# Patient Record
Sex: Female | Born: 1990
Health system: Southern US, Community
[De-identification: ages and names within clinical notes are randomized; demographics above are authoritative.]

## PROBLEM LIST (undated history)

## (undated) DIAGNOSIS — J45909 Unspecified asthma, uncomplicated: Secondary | ICD-10-CM

---

## 2015-05-19 DIAGNOSIS — L2084 Intrinsic (allergic) eczema: Secondary | ICD-10-CM | POA: Insufficient documentation

## 2017-12-01 DIAGNOSIS — Z720 Tobacco use: Secondary | ICD-10-CM | POA: Insufficient documentation

## 2018-05-07 DIAGNOSIS — L309 Dermatitis, unspecified: Secondary | ICD-10-CM | POA: Diagnosis not present

## 2018-06-18 DIAGNOSIS — F411 Generalized anxiety disorder: Secondary | ICD-10-CM | POA: Diagnosis not present

## 2018-06-29 DIAGNOSIS — R404 Transient alteration of awareness: Secondary | ICD-10-CM | POA: Diagnosis not present

## 2018-06-29 DIAGNOSIS — F10929 Alcohol use, unspecified with intoxication, unspecified: Secondary | ICD-10-CM | POA: Diagnosis not present

## 2018-07-11 DIAGNOSIS — L2089 Other atopic dermatitis: Secondary | ICD-10-CM | POA: Diagnosis not present

## 2018-07-14 DIAGNOSIS — L309 Dermatitis, unspecified: Secondary | ICD-10-CM | POA: Diagnosis not present

## 2018-07-14 DIAGNOSIS — R21 Rash and other nonspecific skin eruption: Secondary | ICD-10-CM | POA: Diagnosis not present

## 2018-10-06 ENCOUNTER — Other Ambulatory Visit: Payer: Self-pay

## 2018-10-06 ENCOUNTER — Encounter (HOSPITAL_COMMUNITY): Payer: Self-pay | Admitting: Emergency Medicine

## 2018-10-06 ENCOUNTER — Emergency Department (HOSPITAL_COMMUNITY)
Admission: EM | Admit: 2018-10-06 | Discharge: 2018-10-06 | Disposition: A | Discharge status: Home / Self Care | Payer: BLUE CROSS/BLUE SHIELD | Attending: Emergency Medicine | Admitting: Emergency Medicine

## 2018-10-06 DIAGNOSIS — R0789 Other chest pain: Secondary | ICD-10-CM | POA: Diagnosis not present

## 2018-10-06 DIAGNOSIS — R9389 Abnormal findings on diagnostic imaging of other specified body structures: Secondary | ICD-10-CM | POA: Diagnosis not present

## 2018-10-06 DIAGNOSIS — L299 Pruritus, unspecified: Secondary | ICD-10-CM | POA: Diagnosis not present

## 2018-10-06 DIAGNOSIS — R05 Cough: Secondary | ICD-10-CM | POA: Diagnosis not present

## 2018-10-06 DIAGNOSIS — T7840XA Allergy, unspecified, initial encounter: Secondary | ICD-10-CM

## 2018-10-06 DIAGNOSIS — T7801XA Anaphylactic reaction due to peanuts, initial encounter: Secondary | ICD-10-CM | POA: Insufficient documentation

## 2018-10-06 DIAGNOSIS — J45909 Unspecified asthma, uncomplicated: Secondary | ICD-10-CM | POA: Diagnosis not present

## 2018-10-06 MED ORDER — EPINEPHRINE 0.3 MG/0.3ML IJ SOAJ
0.3000 mg | Freq: Once | INTRAMUSCULAR | Status: AC
  Administered 2018-10-06: 0.3 mg via INTRAMUSCULAR
  Filled 2018-10-06: qty 0.3

## 2018-10-06 MED ORDER — DIPHENHYDRAMINE HCL 50 MG/ML IJ SOLN
25.0000 mg | Freq: Once | INTRAMUSCULAR | Status: AC
  Administered 2018-10-06: 25 mg via INTRAVENOUS
  Filled 2018-10-06: qty 1

## 2018-10-06 MED ORDER — FAMOTIDINE IN NACL 20-0.9 MG/50ML-% IV SOLN
20.0000 mg | Freq: Once | INTRAVENOUS | Status: AC
  Administered 2018-10-06: 20 mg via INTRAVENOUS
  Filled 2018-10-06: qty 50

## 2018-10-06 NOTE — Discharge Instructions (Signed)
Return to ED if you have another allergic reaction.

## 2018-10-06 NOTE — ED Notes (Signed)
[facility] came to this nurse, [facility] is at [facility] wanting discharge papers.  Merlinda FrederickJennifer, [place] RN gave pt discharge instructions and paperwork.

## 2018-10-06 NOTE — ED Provider Notes (Signed)
  Physical Exam  BP 106/73 (BP Location: Right Arm)   Pulse 64   Temp 98.2 F (36.8 C) (Oral)   Resp (!) 24   Ht 5\' 6"  (1.676 m)   Wt 59 kg   SpO2 98%   BMI 20.98 kg/m   Physical Exam Vitals signs and nursing note reviewed.  Constitutional:      General: She is not in acute distress.    Appearance: She is well-developed. She is not diaphoretic.     Comments: Speaking in complete sentences without difficulty.  HENT:     Head: Normocephalic and atraumatic.  Eyes:     General: No scleral icterus.    Conjunctiva/sclera: Conjunctivae normal.  Neck:     Musculoskeletal: Normal range of motion.  Pulmonary:     Effort: Pulmonary effort is normal. No respiratory distress.  Skin:    Findings: No rash.  Neurological:     Mental Status: She is alert.     ED Course/Procedures     Procedures  MDM  Care handed off from previous provider Dr. Rhunette Croft.  Please see his note for further detail.  Briefly, patient is a 28 year old female who is allergic to peanuts.  She has a history of severe allergic reaction to peanuts.  She accidentally ingested peanuts at [facility] and began having itching to the back of her throat and lip swelling.  She is given IM epi and Benadryl by EMS and at the facility.  She continues to have itching.  She was given repeat dose of IM epi here, along with IV Benadryl and IV Pepcid.  Previous provider did not appreciate any lip swelling on evaluation.  Plan is to observe until 10 PM and reassess.   10:01 PM Patient with improvement in her symptoms.  No lip swelling, signs of respiratory distress or airway compromise noted on my exam.  States that itching has resolved.  She is at [facility] and they have EpiPen's there.  Patient is hemodynamically stable, in NAD, and able to ambulate in the ED. Evaluation does not show pathology that would require ongoing emergent intervention or inpatient treatment. I explained the diagnosis to the patient. Pain has been  managed and has no complaints prior to discharge. Patient is comfortable with above plan and is stable for discharge at this time. All questions were answered prior to disposition. Strict return precautions for returning to the ED were discussed. Encouraged follow up with PCP.    Portions of this note were generated with Scientist, clinical (histocompatibility and immunogenetics). Dictation errors may occur despite best attempts at proofreading.    Dietrich Pates, PA-C 10/06/18 2202    Rolan Bucco, MD 10/06/18 2248

## 2018-10-06 NOTE — ED Provider Notes (Addendum)
[facility] EMERGENCY DEPARTMENT Provider Note   CSN: [id number] Arrival date & time: 10/06/18  1802     History   Chief Complaint No chief complaint on file.   HPI Sharon Freeman is a 28 y.o. female.  HPI 28 year old female comes in with chief complaint of allergic reaction.  Patient states that she has history of severe allergic reaction to peanuts, and thinks that she ingested peanuts at [facility]. Medially after ingesting the peanut she started having itching to the back of her throat and perhaps some lip swelling.  She was given 50 mg of Benadryl by the facility and in route she was given epi IM.  Patient states that she continues to have mild itching.  She denies any associated shortness of breath, wheezing, nausea, vomiting, abdominal pain, diarrhea.  She also denies any rash.   History reviewed. No pertinent past medical history.  There are no active problems to display for this patient.   History reviewed. No pertinent surgical history.   OB History   No obstetric history on file.      Home Medications    Prior to Admission medications   Not on File    Family History No family history on file.  Social History Social History   Tobacco Use  . Smoking status: Not on file  Substance Use Topics  . Alcohol use: Not on file  . Drug use: Not on file     Allergies   Patient has no allergy information on record.   Review of Systems Review of Systems  Constitutional: Positive for activity change.  HENT: Positive for sore throat. Negative for drooling, facial swelling, trouble swallowing and voice change.   Respiratory: Negative for chest tightness and shortness of breath.   Cardiovascular: Negative for chest pain.  Allergic/Immunologic: Positive for food allergies.  All other systems reviewed and are negative.    Physical Exam Updated Vital Signs BP 106/73 (BP Location: Right Arm)   Pulse 64   Temp 98.2 F (36.8 C)  (Oral)   Resp (!) 24   Ht 5\' 6"  (1.676 m)   Wt 59 kg   SpO2 98%   BMI 20.98 kg/m   Physical Exam Vitals signs and nursing note reviewed.  Constitutional:      Appearance: She is well-developed.  HENT:     Head: Normocephalic and atraumatic.     Mouth/Throat:     Comments: Neuro exam reveals no edema of the tongue or oral mucosa.  Patient has no drooling, no trismus and there is no stridor. Neck:     Musculoskeletal: Normal range of motion and neck supple.  Cardiovascular:     Rate and Rhythm: Normal rate.  Pulmonary:     Effort: Pulmonary effort is normal.     Breath sounds: No wheezing.  Abdominal:     General: Bowel sounds are normal.  Skin:    General: Skin is warm and dry.  Neurological:     Mental Status: She is alert and oriented to person, place, and time.      ED Treatments / Results  Labs (all labs ordered are listed, but only abnormal results are displayed) Labs Reviewed - No data to display  EKG None  Radiology No results found.  Procedures .Critical Care Performed by: Derwood Kaplan, MD Authorized by: Derwood Kaplan, MD   Critical care provider statement:    Critical care time (minutes):  32   Critical care time was exclusive of:  Separately  billable procedures and treating other patients   Critical care was time spent personally by me on the following activities:  Discussions with consultants, evaluation of patient's response to treatment, examination of patient, ordering and performing treatments and interventions, ordering and review of laboratory studies, ordering and review of radiographic studies, pulse oximetry, re-evaluation of patient's condition, obtaining history from patient or surrogate and review of old charts   (including critical care time)  Medications Ordered in ED Medications  famotidine (PEPCID) IVPB 20 mg premix (20 mg Intravenous New Bag/Given 10/06/18 1829)  EPINEPHrine (EPI-PEN) injection 0.3 mg (0.3 mg Intramuscular  Given 10/06/18 1830)  diphenhydrAMINE (BENADRYL) injection 25 mg (25 mg Intravenous Given 10/06/18 1828)     Initial Impression / Assessment and Plan / ED Course  I have reviewed the triage vital signs and the nursing notes.  Pertinent labs & imaging results that were available during my care of the patient were reviewed by me and considered in my medical decision making (see chart for details).     Patient comes in with chief complaint of allergic reaction. It appears that she had peanuts, and since that time she has been having itching to the back of her throat.  She is already received IM epi along with Benadryl.  She still feeling some scratching at the back of her throat.  There is no lip swelling on my evaluation, however she states there was some lip swelling early on.  We will give her another IM epinephrine and IV Benadryl along with IV Pepcid. Lung exam is clear.    Final Clinical Impressions(s) / ED Diagnoses   Final diagnoses:  Allergic reaction, initial encounter    ED Discharge Orders    None       Derwood Kaplan, MD 10/06/18 Ronna Polio, MD 10/06/18 1905

## 2018-10-06 NOTE — ED Notes (Signed)
Pt easily aroused when nurse walked in.  No c/o throat, lip, tongue itching or swelling.  Pt states "it feels normal now".

## 2018-10-06 NOTE — ED Notes (Addendum)
Pt was not in room when this nurse went to give discharge papers and instructions.

## 2018-10-06 NOTE — ED Triage Notes (Signed)
Pt presents to ED via [facility]. Pt was at [facility] when she ate some nuts at dinner that was cooked into the food. Pt is allergic to nuts. [facility] gave pt 50 mg po Benadryl and an epipen. Pt stated her lips and throat were red and itching. Itching is still present.  BP 106/68 HR 65 100% RA

## 2018-11-23 DIAGNOSIS — R05 Cough: Secondary | ICD-10-CM | POA: Diagnosis not present

## 2018-11-23 DIAGNOSIS — R109 Unspecified abdominal pain: Secondary | ICD-10-CM | POA: Diagnosis not present

## 2018-11-24 ENCOUNTER — Emergency Department (HOSPITAL_COMMUNITY)
Admission: EM | Admit: 2018-11-24 | Discharge: 2018-11-24 | Disposition: A | Discharge status: Home / Self Care | Payer: BLUE CROSS/BLUE SHIELD | Attending: Emergency Medicine | Admitting: Emergency Medicine

## 2018-11-24 ENCOUNTER — Emergency Department (HOSPITAL_COMMUNITY): Payer: BLUE CROSS/BLUE SHIELD

## 2018-11-24 ENCOUNTER — Encounter: Payer: Self-pay | Admitting: Emergency Medicine

## 2018-11-24 DIAGNOSIS — J45909 Unspecified asthma, uncomplicated: Secondary | ICD-10-CM | POA: Diagnosis not present

## 2018-11-24 DIAGNOSIS — F17210 Nicotine dependence, cigarettes, uncomplicated: Secondary | ICD-10-CM | POA: Insufficient documentation

## 2018-11-24 DIAGNOSIS — Z79899 Other long term (current) drug therapy: Secondary | ICD-10-CM | POA: Insufficient documentation

## 2018-11-24 DIAGNOSIS — R1011 Right upper quadrant pain: Secondary | ICD-10-CM | POA: Diagnosis not present

## 2018-11-24 HISTORY — DX: Unspecified asthma, uncomplicated: J45.909

## 2018-11-24 LAB — COMPREHENSIVE METABOLIC PANEL
ALT: 14 U/L (ref 0–44)
AST: 16 U/L (ref 15–41)
Albumin: 3.9 g/dL (ref 3.5–5.0)
Alkaline Phosphatase: 44 U/L (ref 38–126)
Anion gap: 7 (ref 5–15)
BUN: 9 mg/dL (ref 6–20)
CO2: 25 mmol/L (ref 22–32)
Calcium: 9.4 mg/dL (ref 8.9–10.3)
Chloride: 107 mmol/L (ref 98–111)
Creatinine, Ser: 0.81 mg/dL (ref 0.44–1.00)
GFR calc Af Amer: >60 mL/min (ref >60)
GFR calc non Af Amer: >60 mL/min (ref >60)
GLUCOSE: 85 mg/dL (ref 70–99)
Potassium: 3.7 mmol/L (ref 3.5–5.1)
SODIUM: 139 mmol/L (ref 135–145)
Total Bilirubin: 0.7 mg/dL (ref 0.3–1.2)
Total Protein: 6.7 g/dL (ref 6.5–8.1)

## 2018-11-24 LAB — CBC WITH DIFFERENTIAL/PLATELET
Abs Immature Granulocytes: 0.03 10*3/uL (ref 0.00–0.07)
BASOS PCT: 1 %
Basophils Absolute: 0.1 10*3/uL (ref 0.0–0.1)
Eosinophils Absolute: 0.6 10*3/uL — ABNORMAL HIGH (ref 0.0–0.5)
Eosinophils Relative: 6 %
HCT: 40.3 % (ref 36.0–46.0)
Hemoglobin: 13.1 g/dL (ref 12.0–15.0)
Immature Granulocytes: 0 %
Lymphocytes Relative: 9 %
Lymphs Abs: 0.9 10*3/uL (ref 0.7–4.0)
MCH: 30.5 pg (ref 26.0–34.0)
MCHC: 32.5 g/dL (ref 30.0–36.0)
MCV: 93.7 fL (ref 80.0–100.0)
Monocytes Absolute: 1.1 10*3/uL — ABNORMAL HIGH (ref 0.1–1.0)
Monocytes Relative: 11 %
NEUTROS PCT: 73 %
NRBC: 0 % (ref 0.0–0.2)
Neutro Abs: 7.2 10*3/uL (ref 1.7–7.7)
PLATELETS: 255 10*3/uL (ref 150–400)
RBC: 4.3 MIL/uL (ref 3.87–5.11)
RDW: 12 % (ref 11.5–15.5)
WBC: 9.8 10*3/uL (ref 4.0–10.5)

## 2018-11-24 LAB — LIPASE, BLOOD: Lipase: 36 U/L (ref 11–51)

## 2018-11-24 NOTE — ED Notes (Signed)
Patient verbalizes understanding of discharge instructions. Opportunity for questioning and answers were provided. Armband removed by staff, pt discharged from ED home via POV.  

## 2018-11-24 NOTE — ED Triage Notes (Signed)
Pt was seen at [facility] yesterday, has had right side rib cage pain that hurts with inspiration since Wednesday, states this happened about a month ago as well.

## 2018-11-24 NOTE — ED Notes (Signed)
EDP at bedside  

## 2018-11-24 NOTE — ED Provider Notes (Signed)
[facility] EMERGENCY DEPARTMENT Provider Note   CSN: [id number] Arrival date & time: 11/24/18  1219    History   Chief Complaint Chief Complaint  Patient presents with  . rib pain    HPI Sharon Freeman is a 28 y.o. female.     The history is provided by the patient and medical records. No language interpreter was used.   Sharon Freeman is a 28 y.o. female who presents to the Emergency Department complaining of right upper abdominal pain and right lateral rib cage pain.  Patient states that her pain began about 4 days ago.  No inciting event.  No known injury.  She was trying ibuprofen to help her pain with did help somewhat.  She saw the urgent care yesterday where she states an x-ray was done and she was told it was normal.  She was told that this was likely musculoskeletal and started on prednisone for inflammation, however the pharmacy did not have it, therefore she has not started it yet.  The pharmacy told her they should get it in today or tomorrow.  The urgent care told her that there was a chance this could be her gallbladder, therefore if her symptoms worsen, she should come to the emergency department.  When she woke up this morning, the pain was much worse, prompting her to seek care.  She reports associated nausea only after eating.  She has not had any emesis.  No fever or chills.  No urinary symptoms.  Past Medical History:  Diagnosis Date  . Asthma     There are no active problems to display for this patient.   History reviewed. No pertinent surgical history.   OB History   No obstetric history on file.      Home Medications    Prior to Admission medications   Medication Sig Start Date End Date Taking? Authorizing Provider  azithromycin (ZITHROMAX) 250 MG tablet Take 250-500 mg by mouth See admin instructions. 500mg  on day one then 250mg  daily until gone Started on 10-06-17    [provider]  benzonatate (TESSALON) 100 MG  capsule Take 100 mg by mouth 3 (three) times daily as needed for cough.    [provider]  clotrimazole (LOTRIMIN) 1 % cream Apply 1 application topically daily as needed (rash).    [provider]  diphenhydrAMINE (BENADRYL) 25 MG tablet Take 50 mg by mouth once.    [provider]  EPINEPHrine 0.3 mg/0.3 mL IJ SOAJ injection Inject 0.3 mg into the muscle as needed for anaphylaxis.    [provider]  fexofenadine (ALLEGRA) 180 MG tablet Take 180 mg by mouth daily.    [provider]  FLUoxetine (PROZAC) 20 MG capsule Take 20 mg by mouth daily.    [provider]  Fluticasone-Salmeterol (ADVAIR) 100-50 MCG/DOSE AEPB Inhale 1 puff into the lungs daily.    [provider]  halobetasol (ULTRAVATE) 0.05 % cream Apply 1 application topically daily as needed (eczema).    [provider]  hydrocortisone 2.5 % cream Apply 1 application topically daily as needed (itching).    [provider]  ibuprofen (ADVIL,MOTRIN) 600 MG tablet Take 600 mg by mouth every 6 (six) hours as needed for headache, moderate pain or cramping.    [provider]  ketotifen (ZADITOR) 0.025 % ophthalmic solution Place 1 drop into both eyes daily as needed (dry eyes).    [provider]  methylPREDNISolone (MEDROL DOSEPAK) 4 MG TBPK  tablet Take by mouth as directed. Started on 10-06-17    [provider]  PROAIR HFA 108 [phone] Base) MCG/ACT inhaler Inhale 1-2 puffs into the lungs every 4 (four) hours as needed for shortness of breath. 09/22/18   [provider]  propranolol (INDERAL) 20 MG tablet Take 20 mg by mouth 3 (three) times daily.    [provider]  traZODone (DESYREL) 50 MG tablet Take 50 mg by mouth at bedtime as needed for sleep.    [provider]    Family History History reviewed. No pertinent family history.  Social History Social History   Tobacco Use  . Smoking status: Current  Every Day Smoker    Packs/day: 1.00    Types: Cigarettes  . Smokeless tobacco: Never Used  Substance Use Topics  . Alcohol use: Not Currently    Comment: sober for 90 days   . Drug use: Not Currently     Allergies   Other and Shellfish allergy   Review of Systems Review of Systems  Gastrointestinal: Positive for abdominal pain and nausea. Negative for constipation, diarrhea and vomiting.  Musculoskeletal: Positive for arthralgias and myalgias.  All other systems reviewed and are negative.    Physical Exam Updated Vital Signs BP 114/80 (BP Location: Right Arm)   Pulse 90   Temp 99 F (37.2 C) (Oral)   Resp 14   LMP 11/07/2018 (Approximate)   SpO2 100%   Physical Exam Vitals signs and nursing note reviewed.  Constitutional:      General: She is not in acute distress.    Appearance: She is well-developed.     Comments: Nontoxic-appearing.  HENT:     Head: Normocephalic and atraumatic.  Neck:     Musculoskeletal: Neck supple.  Cardiovascular:     Rate and Rhythm: Normal rate and regular rhythm.     Heart sounds: Normal heart sounds. No murmur.  Pulmonary:     Effort: Pulmonary effort is normal. No respiratory distress.     Breath sounds: Normal breath sounds.  Abdominal:     General: There is no distension.     Palpations: Abdomen is soft.     Comments: Tenderness to palpation of the right upper quadrant.  He also has tenderness along the right lower lateral rib cage region.  No overlying skin changes appreciated.  Skin:    General: Skin is warm and dry.  Neurological:     Mental Status: She is alert and oriented to person, place, and time.      ED Treatments / Results  Labs (all labs ordered are listed, but only abnormal results are displayed) Labs Reviewed  CBC WITH DIFFERENTIAL/PLATELET - Abnormal; Notable for the following components:      Result Value   Monocytes Absolute 1.1 (*)    Eosinophils Absolute 0.6 (*)    All other components within  normal limits  COMPREHENSIVE METABOLIC PANEL  LIPASE, BLOOD    EKG None  Radiology US Abdomen Limited Ruq  Result Date: 11/24/2018 CLINICAL DATA:  Right upper quadrant abdominal pain for 3 days EXAM: ULTRASOUND ABDOMEN LIMITED RIGHT UPPER QUADRANT COMPARISON:  None. FINDINGS: Gallbladder: No gallstones or wall thickening visualized. No sonographic Murphy sign noted by sonographer. Common bile duct: Diameter: 1 mm Liver: No focal lesion identified. Within normal limits in parenchymal echogenicity. Portal vein is patent on color Doppler imaging with normal direction of blood flow towards the liver. IMPRESSION: Normal right upper quadrant abdominal sonogram, with no cholelithiasis. Electronically Signed  By: Delbert Phenix M.D.   On: 11/24/2018 14:36    Procedures Procedures (including critical care time)  Medications Ordered in ED Medications - No data to display   Initial Impression / Assessment and Plan / ED Course  I have reviewed the triage vital signs and the nursing notes.  Pertinent labs & imaging results that were available during my care of the patient were reviewed by me and considered in my medical decision making (see chart for details).       Sharon Freeman is a 28 y.o. female who presents to ED for right upper quadrant abdominal pain versus right rib cage pain.  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  She was seen at the urgent care yesterday where she reports having normal x-ray.  I cannot see the results of this, therefore did recommend repeating imaging, however she states that they told her it was normal and does not see the need to repeat. On my evaluation, she appears quite well. She is afebrile, hemodynamically stable with tenderness to the right upper quadrant. She does have pain to the rib cage as well. Will obtain labs and RUQ ultrasound to further evaluate abdominal pain.   Labs reviewed and  reassuring. Ultrasound with no acute findings.   She is tolerating PO, ambulatory in ED. Repeat abd exam with no peritoneal signs. Encouraged PCP follow up. Symptomatic home care instructions and return precautions discussed and all questions answered.    Final Clinical Impressions(s) / ED Diagnoses   Final diagnoses:  RUQ pain    ED Discharge Orders    None       Sharne Linders, Chase Picket, PA-C 11/24/18 1507    Shaune Pollack, MD 11/24/18 Paulo Fruit

## 2018-11-24 NOTE — Discharge Instructions (Signed)
It was my pleasure taking care of you today!   Fortunately, your blood work and ultrasound today were normal.  Continue taking Tylenol and Ibuprofen as needed for pain.   Follow up with your doctor.   Return to ER for new or worsening symptoms, any additional concerns.

## 2019-07-29 DIAGNOSIS — Z20828 Contact with and (suspected) exposure to other viral communicable diseases: Secondary | ICD-10-CM | POA: Diagnosis not present

## 2019-08-01 DIAGNOSIS — F411 Generalized anxiety disorder: Secondary | ICD-10-CM | POA: Diagnosis not present

## 2019-08-12 DIAGNOSIS — F411 Generalized anxiety disorder: Secondary | ICD-10-CM | POA: Diagnosis not present

## 2019-08-27 DIAGNOSIS — F411 Generalized anxiety disorder: Secondary | ICD-10-CM | POA: Diagnosis not present

## 2019-09-10 DIAGNOSIS — F411 Generalized anxiety disorder: Secondary | ICD-10-CM | POA: Diagnosis not present

## 2019-09-17 DIAGNOSIS — F411 Generalized anxiety disorder: Secondary | ICD-10-CM | POA: Diagnosis not present

## 2019-10-03 DIAGNOSIS — F411 Generalized anxiety disorder: Secondary | ICD-10-CM | POA: Diagnosis not present

## 2019-10-15 DIAGNOSIS — F411 Generalized anxiety disorder: Secondary | ICD-10-CM | POA: Diagnosis not present

## 2019-10-31 DIAGNOSIS — F411 Generalized anxiety disorder: Secondary | ICD-10-CM | POA: Diagnosis not present

## 2019-11-13 DIAGNOSIS — F411 Generalized anxiety disorder: Secondary | ICD-10-CM | POA: Diagnosis not present

## 2019-11-26 DIAGNOSIS — F411 Generalized anxiety disorder: Secondary | ICD-10-CM | POA: Diagnosis not present

## 2019-11-28 ENCOUNTER — Institutional Professional Consult (permissible substitution): Payer: BLUE CROSS/BLUE SHIELD | Admitting: Family Medicine

## 2019-12-05 ENCOUNTER — Ambulatory Visit: Payer: Self-pay | Attending: Internal Medicine

## 2019-12-10 DIAGNOSIS — F411 Generalized anxiety disorder: Secondary | ICD-10-CM | POA: Diagnosis not present

## 2019-12-19 ENCOUNTER — Ambulatory Visit: Payer: BLUE CROSS/BLUE SHIELD | Admitting: Family Medicine

## 2019-12-19 ENCOUNTER — Encounter: Payer: Self-pay | Admitting: Family Medicine

## 2019-12-19 ENCOUNTER — Other Ambulatory Visit: Payer: Self-pay

## 2019-12-19 VITALS — BP 110/70 | HR 82 | Temp 97.0°F | Ht 66.0 in | Wt 125.0 lb

## 2019-12-19 DIAGNOSIS — J302 Other seasonal allergic rhinitis: Secondary | ICD-10-CM | POA: Diagnosis not present

## 2019-12-19 DIAGNOSIS — F1911 Other psychoactive substance abuse, in remission: Secondary | ICD-10-CM

## 2019-12-19 DIAGNOSIS — R5383 Other fatigue: Secondary | ICD-10-CM | POA: Diagnosis not present

## 2019-12-19 DIAGNOSIS — Z862 Personal history of diseases of the blood and blood-forming organs and certain disorders involving the immune mechanism: Secondary | ICD-10-CM

## 2019-12-19 DIAGNOSIS — F39 Unspecified mood [affective] disorder: Secondary | ICD-10-CM

## 2019-12-19 DIAGNOSIS — J4520 Mild intermittent asthma, uncomplicated: Secondary | ICD-10-CM | POA: Insufficient documentation

## 2019-12-19 NOTE — Progress Notes (Signed)
   Subjective:    Patient ID: Sharon Freeman, female    DOB: Mar 08, 1991, 29 y.o.   MRN: [id number]  HPI Chief Complaint  Patient presents with  . new pt    new pt get established. no concerns   She is new to the practice and here to establish care. Previous medical care: moved here from [place], [place] about 1 1/2 years ago   Reports a history of severe substance abuse. States she lives at [facility] and has been there for several months.  They are prescribing her medications and she is involved in counseling.  Reports doing well.  States she has been having intermittent spells of dry eyes, fatigue and anxiety.  States this occurs every few weeks and last for approximately 3 days.  Symptoms go away on their own.  No known triggers. She is not sure if this occurs with her menstrual cycles.  Reports a history of anemia.    Smoker- for the past 2 years, 7-10 cigs per day   Asthma- she has Advair but is not currently using it. Uses albuterol no more than 2x weekly. Exercise is a trigger.   Seasonal allergies- uses Flonase   Allergies to nuts and shellfish and has an Epi-pen   Social history: lives at [facility]. 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  Is not in a relationship  No kids    Reviewed allergies, medications, past medical, surgical, family, and social history.    Review of Systems Pertinent positives and negatives in the history of present illness.     Objective:   Physical Exam BP 110/70   Pulse 82   Temp (!) 97 F (36.1 C)   Ht 5\' 6"  (1.676 m)   Wt 125 lb (56.7 kg)   LMP 12/09/2019   BMI 20.18 kg/m   Alert and in no distress. Neck is supple without adenopathy or thyromegaly. Cardiac exam shows a regular sinus rhythm without murmurs or gallops. Lungs are clear to auscultation. Extremities without edema. Skin is warm and dry. No pallor.        Assessment & Plan:    Fatigue, unspecified type - Plan: CBC with Differential/Platelet, Comprehensive metabolic panel, TSH, T4, free, T3, VITAMIN D 25 Hydroxy (Vit-D Deficiency, Fractures), Vitamin B12, Iron, TIBC and Ferritin Panel -She is new to the practice and here to establish care.  Discussed multiple etiologies for fatigue.  Her exam is benign.  No red flag symptoms. Will check labs and follow-up.  She will return for a fasting CPE in 1 to 2 months.  Mild intermittent asthma without complication -Controlled.  She will let me know if she is needing her albuterol more than 2 times during the day per week or more than once at night per month.  She has Advair but does not need it at this time  Seasonal allergies -Not very bothersome at this point.  She uses Flonase  Substance abuse in remission [facility]) -Congratulated her on being in rehab.  Living at [facility].  Mood disorder (HCC) -Medications are managed by her psychiatrist and she also has therapist at [facility]  History of anemia - Plan: CBC with Differential/Platelet, Vitamin B12, Iron, TIBC and Ferritin Panel -Check labs in follow-up.  Is not on an iron supplement

## 2019-12-19 NOTE — Patient Instructions (Signed)
It was a pleasure meeting you today.   If you are needing your albuterol inhaler more than twice per week during the day or more than once per month at night, let me know. You will need to start back on your daily Advair inhaler at that point.   I recommend that you look for triggers for your fatigue and dry eyes.  We will check labs and follow up.

## 2019-12-20 LAB — CBC WITH DIFFERENTIAL/PLATELET
Basophils Absolute: 0.1 10*3/uL (ref 0.0–0.2)
Basos: 1 %
EOS (ABSOLUTE): 0.5 10*3/uL — ABNORMAL HIGH (ref 0.0–0.4)
Eos: 9 %
Hematocrit: 42.9 % (ref 34.0–46.6)
Hemoglobin: 14.7 g/dL (ref 11.1–15.9)
Immature Grans (Abs): 0 10*3/uL (ref 0.0–0.1)
Immature Granulocytes: 0 %
Lymphocytes Absolute: 1.5 10*3/uL (ref 0.7–3.1)
Lymphs: 25 %
MCH: 32 pg (ref 26.6–33.0)
MCHC: 34.3 g/dL (ref 31.5–35.7)
MCV: 93 fL (ref 79–97)
Monocytes Absolute: 0.5 10*3/uL (ref 0.1–0.9)
Monocytes: 8 %
Neutrophils Absolute: 3.2 10*3/uL (ref 1.4–7.0)
Neutrophils: 57 %
Platelets: 292 10*3/uL (ref 150–450)
RBC: 4.6 x10E6/uL (ref 3.77–5.28)
RDW: 11.7 % (ref 11.7–15.4)
WBC: 5.8 10*3/uL (ref 3.4–10.8)

## 2019-12-20 LAB — COMPREHENSIVE METABOLIC PANEL
ALT: 9 IU/L (ref 0–32)
AST: 11 IU/L (ref 0–40)
Albumin/Globulin Ratio: 2.2 (ref 1.2–2.2)
Albumin: 4.8 g/dL (ref 3.8–4.8)
Alkaline Phosphatase: 53 IU/L (ref 39–117)
BUN/Creatinine Ratio: 12 (ref 9–23)
BUN: 10 mg/dL (ref 6–20)
Bilirubin Total: 0.2 mg/dL (ref 0.0–1.2)
CO2: 26 mmol/L (ref 20–29)
Calcium: 9.8 mg/dL (ref 8.7–10.2)
Chloride: 102 mmol/L (ref 96–106)
Creatinine, Ser: 0.85 mg/dL (ref 0.57–1.00)
GFR calc Af Amer: 106 mL/min/{1.73_m2} (ref >59)
GFR calc non Af Amer: 92 mL/min/{1.73_m2} (ref >59)
Globulin, Total: 2.2 g/dL (ref 1.5–4.5)
Glucose: 107 mg/dL — ABNORMAL HIGH (ref 65–99)
Potassium: 4.4 mmol/L (ref 3.5–5.2)
Sodium: 143 mmol/L (ref 134–144)
Total Protein: 7 g/dL (ref 6.0–8.5)

## 2019-12-20 LAB — T4, FREE: Free T4: 1.21 ng/dL (ref 0.82–1.77)

## 2019-12-20 LAB — IRON,TIBC AND FERRITIN PANEL
Ferritin: 30 ng/mL (ref 15–150)
Iron Saturation: 29 % (ref 15–55)
Iron: 82 ug/dL (ref 27–159)
Total Iron Binding Capacity: 278 ug/dL (ref 250–450)
UIBC: 196 ug/dL (ref 131–425)

## 2019-12-20 LAB — VITAMIN D 25 HYDROXY (VIT D DEFICIENCY, FRACTURES): Vit D, 25-Hydroxy: 21.4 ng/mL — ABNORMAL LOW (ref 30.0–100.0)

## 2019-12-20 LAB — VITAMIN B12: Vitamin B-12: 455 pg/mL (ref 232–1245)

## 2019-12-20 LAB — T3: T3, Total: 81 ng/dL (ref 71–180)

## 2019-12-20 LAB — TSH: TSH: 1.11 u[IU]/mL (ref 0.450–4.500)

## 2019-12-27 DIAGNOSIS — F411 Generalized anxiety disorder: Secondary | ICD-10-CM | POA: Diagnosis not present

## 2020-01-07 DIAGNOSIS — F411 Generalized anxiety disorder: Secondary | ICD-10-CM | POA: Diagnosis not present

## 2020-01-23 DIAGNOSIS — F411 Generalized anxiety disorder: Secondary | ICD-10-CM | POA: Diagnosis not present

## 2020-01-28 DIAGNOSIS — F411 Generalized anxiety disorder: Secondary | ICD-10-CM | POA: Diagnosis not present

## 2020-02-12 DIAGNOSIS — F411 Generalized anxiety disorder: Secondary | ICD-10-CM | POA: Diagnosis not present

## 2020-02-25 DIAGNOSIS — F411 Generalized anxiety disorder: Secondary | ICD-10-CM | POA: Diagnosis not present

## 2020-03-01 NOTE — Patient Instructions (Addendum)
I recommend scheduling a dental exam soon.   We will be in touch with your results.     Preventive Care 72-31 Years Old, Female Preventive care refers to visits with your health care provider and lifestyle choices that can promote health and wellness. This includes:  A yearly physical exam. This may also be called an annual well check.  Regular dental visits and eye exams.  Immunizations.  Screening for certain conditions.  Healthy lifestyle choices, such as eating a healthy diet, getting regular exercise, not using drugs or products that contain nicotine and tobacco, and limiting alcohol use. What can I expect for my preventive care visit? Physical exam Your health care provider will check your:  Height and weight. This may be used to calculate body mass index (BMI), which tells if you are at a healthy weight.  Heart rate and blood pressure.  Skin for abnormal spots. Counseling Your health care provider may ask you questions about your:  Alcohol, tobacco, and drug use.  Emotional well-being.  Home and relationship well-being.  Sexual activity.  Eating habits.  Work and work Statistician.  Method of birth control.  Menstrual cycle.  Pregnancy history. What immunizations do I need?  Influenza (flu) vaccine  This is recommended every year. Tetanus, diphtheria, and pertussis (Tdap) vaccine  You may need a Td booster every 10 years. Varicella (chickenpox) vaccine  You may need this if you have not been vaccinated. Human papillomavirus (HPV) vaccine  If recommended by your health care provider, you may need three doses over 6 months. Measles, mumps, and rubella (MMR) vaccine  You may need at least one dose of MMR. You may also need a second dose. Meningococcal conjugate (MenACWY) vaccine  One dose is recommended if you are age 2-21 years and a first-year college student living in a residence hall, or if you have one of several medical conditions. You may  also need additional booster doses. Pneumococcal conjugate (PCV13) vaccine  You may need this if you have certain conditions and were not previously vaccinated. Pneumococcal polysaccharide (PPSV23) vaccine  You may need one or two doses if you smoke cigarettes or if you have certain conditions. Hepatitis A vaccine  You may need this if you have certain conditions or if you travel or work in places where you may be exposed to hepatitis A. Hepatitis B vaccine  You may need this if you have certain conditions or if you travel or work in places where you may be exposed to hepatitis B. Haemophilus influenzae type b (Hib) vaccine  You may need this if you have certain conditions. You may receive vaccines as individual doses or as more than one vaccine together in one shot (combination vaccines). Talk with your health care provider about the risks and benefits of combination vaccines. What tests do I need?  Blood tests  Lipid and cholesterol levels. These may be checked every 5 years starting at age 29.  Hepatitis C test.  Hepatitis B test. Screening  Diabetes screening. This is done by checking your blood sugar (glucose) after you have not eaten for a while (fasting).  Sexually transmitted disease (STD) testing.  BRCA-related cancer screening. This may be done if you have a family history of breast, ovarian, tubal, or peritoneal cancers.  Pelvic exam and Pap test. This may be done every 3 years starting at age 66. Starting at age 22, this may be done every 5 years if you have a Pap test in combination with an HPV  test. Talk with your health care provider about your test results, treatment options, and if necessary, the need for more tests. Follow these instructions at home: Eating and drinking   Eat a diet that includes fresh fruits and vegetables, whole grains, lean protein, and low-fat dairy.  Take vitamin and mineral supplements as recommended by your health care  provider.  Do not drink alcohol if: ? Your health care provider tells you not to drink. ? You are pregnant, may be pregnant, or are planning to become pregnant.  If you drink alcohol: ? Limit how much you have to 0-1 drink a day. ? Be aware of how much alcohol is in your drink. In the [place] one drink equals one 12 oz bottle of beer (355 mL), one 5 oz glass of wine (148 mL), or one 1 oz glass of hard liquor (44 mL). Lifestyle  Take daily care of your teeth and gums.  Stay active. Exercise for at least 30 minutes on 5 or more days each week.  Do not use any products that contain nicotine or tobacco, such as cigarettes, e-cigarettes, and chewing tobacco. If you need help quitting, ask your health care provider.  If you are sexually active, practice safe sex. Use a condom or other form of birth control (contraception) in order to prevent pregnancy and STIs (sexually transmitted infections). If you plan to become pregnant, see your health care provider for a preconception visit. What's next?  Visit your health care provider once a year for a well check visit.  Ask your health care provider how often you should have your eyes and teeth checked.  Stay up to date on all vaccines. This information is not intended to replace advice given to you by your health care provider. Make sure you discuss any questions you have with your health care provider. Document Revised: 05/17/2018 Document Reviewed: 05/17/2018 Elsevier Patient Education  2020 [employer].

## 2020-03-01 NOTE — Progress Notes (Signed)
Subjective:    Patient ID: Sharon Freeman, female    DOB: 04-02-1991, 29 y.o.   MRN: [id number]  HPI Chief Complaint  Patient presents with  . other    fasting CPE   She is fairly new to the practice and here for a complete physical exam. Previous medical care: moved here from [place], [place] about 1 1/2 years ago  CPE: 3 years ago   Other providers: Psychiatrist- Dr. Pecola Lawless Counselor- Fabiola Backer   Vitamin D def- taking 2,000 IUs of vitamin D daily.  Reports having intermittent nausea and she is still having fatigue.  She has not noticed any change or worsening of symptoms since her last visit.  Reports good appetite and sleeping well.  Reports having cloudy urine for the past couple months and urinary frequency for the past 6 months.   Hx of alcoholism- in remission for 18 months   Asthma-no issues and no recent flares  Smoker- trying to stop    Social history: Lives at [employer], works in Therapist, art in [place]: still not as healthy as she wants it to be  Excerise: walks, plays tennis. Active   Immunizations: Tdap 2019  Health maintenance:  Mammogram: N/A Colonoscopy: N/A Last Gynecological Exam: 4 years ago  Last Menstrual cycle: 2 weeks ago  Not currently sexually active and she would like STD testing. Pregnancies: 0 Last Dental Exam: years ago  Last Eye Exam: years ago   Wears seatbelt always, uses sunscreen, smoke detectors in home and functioning, does not text while driving and feels safe in home environment.   Reviewed allergies, medications, past medical, surgical, family, and social history.   Review of Systems Review of Systems Constitutional: -fever, -chills, -sweats, -unexpected weight change,+fatigue ENT: -runny nose, -ear pain, -sore throat Cardiology:  -chest pain, -palpitations, -edema Respiratory: -cough, -shortness of breath, -wheezing Gastroenterology: -abdominal pain, -nausea, -vomiting, -diarrhea, -constipation    Hematology: -bleeding or bruising problems Musculoskeletal: -arthralgias, -myalgias, -joint swelling, -back pain Ophthalmology: -vision changes Urology: -dysuria, -difficulty urinating, -hematuria, -urinary frequency, -urgency Neurology: -headache, -weakness, -tingling, -numbness       Objective:   Physical Exam BP 102/72   Pulse 85   Temp (!) 97.3 F (36.3 C)   Ht 5' 5.5" (1.664 m)   Wt 122 lb 6.4 oz (55.5 kg)   LMP 02/17/2020   SpO2 98%   BMI 20.06 kg/m   General Appearance:    Alert, cooperative, no distress, appears stated age  Head:    Normocephalic, without obvious abnormality, atraumatic  Eyes:    PERRL, conjunctiva/corneas clear, EOM's intact  Ears:    Normal TM's and external ear canals  Nose:  Mask in place  Throat:  Mask in place  Neck:   Supple, no lymphadenopathy;  thyroid:  no   enlargement/tenderness/nodules; no JVD  Back:    Spine nontender, no curvature, ROM normal, no CVA     tenderness  Lungs:     Clear to auscultation bilaterally without wheezes, rales or     ronchi; respirations unlabored  Chest Wall:    No tenderness or deformity   Heart:    Regular rate and rhythm, S1 and S2 normal, no murmur, rub   or gallop  Breast Exam:    No tenderness, masses, or nipple discharge or inversion.      No axillary lymphadenopathy  Abdomen:     Soft, non-tender, nondistended, normoactive bowel sounds,    no masses, no hepatosplenomegaly  Genitalia:  Normal external genitalia without lesions.  BUS and vagina normal; cervix without lesions, or cervical motion tenderness. No abnormal vaginal discharge.  Uterus and adnexa not enlarged, nontender, no masses.  Pap performed.  Chaperone present  Rectal:    Not performed due to age<40 and no related complaints  Extremities:   No clubbing, cyanosis or edema  Pulses:   2+ and symmetric all extremities  Skin:   Skin color, texture, turgor normal, no rashes or lesions  Lymph nodes:   Cervical, supraclavicular, and axillary  nodes normal  Neurologic:   CNII-XII intact, normal strength, sensation and gait; reflexes 2+ and symmetric throughout          Psych:   Normal mood, affect, hygiene and grooming.         Assessment & Plan:  Routine general medical examination at a health care facility - Plan: Lipid panel, Comprehensive metabolic panel -She is here today for a fasting CPE.  Her last physical was approximately 3 years ago.  She is due for Pap smear which we updated today.  Chaperone present.  Preventive healthcare discussed.  Recommend she call and schedule a dental exam since it has been a few years since she had this.  Counseling on healthy lifestyle including diet and exercise.  Recommend she stop smoking and she is working on it.  States she hopes to soon move out of [facility].  She is under the care of her psychiatrist and therapist.  Doing well.  In good spirits. Immunizations reviewed.  She reports having her last Covid vaccine last week.  She will bring [place] the card or call with the information.  Discussed safety and health promotion.  Mild intermittent asthma without complication -Controlled and no issues.  Fatigue, unspecified type -Reviewed recent labs from April when I saw her for fatigue.  She did have low vitamin D and has been taking a supplement.  She is not worsening or develops any new symptoms.  Recommend healthy diet and exercise  Urinary frequency - Plan: POCT Urinalysis DIP (Proadvantage Device), Urine Culture -Urinalysis dipstick shows leukocytes I will treat her for acute cystitis.  Cloudy urine - Plan: POCT Urinalysis DIP (Proadvantage Device), Urine Culture -Urinalysis dipstick does show leukocytes.  Treat for UTI  Screening for lipid disorders - Plan: Lipid panel -Follow-up pending results  Vitamin D deficiency - Plan: VITAMIN D 25 Hydroxy (Vit-D Deficiency, Fractures) -She is currently taking a supplement.  We will recheck her vitamin D level and follow-up  Screening for  cervical cancer - Plan: Cytology - PAP([facility])  Screen for STD (sexually transmitted disease) - Plan: HIV Antibody (routine testing w rflx), RPR -She is not currently sexually active but does request STD testing.  Done per patient request.  Acute cystitis without hematuria - Plan: sulfamethoxazole-trimethoprim (BACTRIM DS) 800-160 MG tablet Discussed that we will treat her for a UTI and I will send her urine for culture.  Increase water intake.

## 2020-03-02 ENCOUNTER — Ambulatory Visit: Payer: BLUE CROSS/BLUE SHIELD | Admitting: Family Medicine

## 2020-03-02 ENCOUNTER — Other Ambulatory Visit: Payer: Self-pay

## 2020-03-02 ENCOUNTER — Encounter: Payer: Self-pay | Admitting: Family Medicine

## 2020-03-02 ENCOUNTER — Other Ambulatory Visit (HOSPITAL_COMMUNITY)
Admission: RE | Admit: 2020-03-02 | Discharge: 2020-03-02 | Disposition: A | Discharge status: Home / Self Care | Payer: BLUE CROSS/BLUE SHIELD | Source: Ambulatory Visit | Attending: Family Medicine | Admitting: Family Medicine

## 2020-03-02 VITALS — BP 102/72 | HR 85 | Temp 97.3°F | Ht 65.5 in | Wt 122.4 lb

## 2020-03-02 DIAGNOSIS — Z124 Encounter for screening for malignant neoplasm of cervix: Secondary | ICD-10-CM

## 2020-03-02 DIAGNOSIS — Z1151 Encounter for screening for human papillomavirus (HPV): Secondary | ICD-10-CM | POA: Diagnosis not present

## 2020-03-02 DIAGNOSIS — Z0000 Encounter for general adult medical examination without abnormal findings: Secondary | ICD-10-CM

## 2020-03-02 DIAGNOSIS — Z113 Encounter for screening for infections with a predominantly sexual mode of transmission: Secondary | ICD-10-CM | POA: Insufficient documentation

## 2020-03-02 DIAGNOSIS — Z13220 Encounter for screening for lipoid disorders: Secondary | ICD-10-CM | POA: Diagnosis not present

## 2020-03-02 DIAGNOSIS — J4520 Mild intermittent asthma, uncomplicated: Secondary | ICD-10-CM | POA: Diagnosis not present

## 2020-03-02 DIAGNOSIS — R87810 Cervical high risk human papillomavirus (HPV) DNA test positive: Secondary | ICD-10-CM | POA: Diagnosis not present

## 2020-03-02 DIAGNOSIS — R5383 Other fatigue: Secondary | ICD-10-CM | POA: Diagnosis not present

## 2020-03-02 DIAGNOSIS — R8290 Unspecified abnormal findings in urine: Secondary | ICD-10-CM | POA: Diagnosis not present

## 2020-03-02 DIAGNOSIS — E559 Vitamin D deficiency, unspecified: Secondary | ICD-10-CM | POA: Insufficient documentation

## 2020-03-02 DIAGNOSIS — R350 Frequency of micturition: Secondary | ICD-10-CM | POA: Diagnosis not present

## 2020-03-02 DIAGNOSIS — N3000 Acute cystitis without hematuria: Secondary | ICD-10-CM

## 2020-03-02 LAB — POCT URINALYSIS DIP (PROADVANTAGE DEVICE)
Bilirubin, UA: NEGATIVE
Blood, UA: NEGATIVE
Glucose, UA: NEGATIVE mg/dL
Ketones, POC UA: NEGATIVE mg/dL
Nitrite, UA: NEGATIVE
Protein Ur, POC: NEGATIVE mg/dL
Specific Gravity, Urine: 1.015
Urobilinogen, Ur: 0.2
pH, UA: 6 (ref 5.0–8.0)

## 2020-03-02 MED ORDER — SULFAMETHOXAZOLE-TRIMETHOPRIM 800-160 MG PO TABS: 1.0000 | ORAL_TABLET | Freq: Two times a day (BID) | ORAL | 0 refills | Status: DC

## 2020-03-03 LAB — COMPREHENSIVE METABOLIC PANEL
ALT: 9 IU/L (ref 0–32)
AST: 14 IU/L (ref 0–40)
Albumin/Globulin Ratio: 2.1 (ref 1.2–2.2)
Albumin: 5.1 g/dL — ABNORMAL HIGH (ref 3.8–4.8)
Alkaline Phosphatase: 50 IU/L (ref 48–121)
BUN/Creatinine Ratio: 15 (ref 9–23)
BUN: 14 mg/dL (ref 6–20)
Bilirubin Total: 0.4 mg/dL (ref 0.0–1.2)
CO2: 22 mmol/L (ref 20–29)
Calcium: 9.8 mg/dL (ref 8.7–10.2)
Chloride: 100 mmol/L (ref 96–106)
Creatinine, Ser: 0.91 mg/dL (ref 0.57–1.00)
GFR calc Af Amer: 97 mL/min/{1.73_m2} (ref >59)
GFR calc non Af Amer: 84 mL/min/{1.73_m2} (ref >59)
Globulin, Total: 2.4 g/dL (ref 1.5–4.5)
Glucose: 84 mg/dL (ref 65–99)
Potassium: 4.6 mmol/L (ref 3.5–5.2)
Sodium: 138 mmol/L (ref 134–144)
Total Protein: 7.5 g/dL (ref 6.0–8.5)

## 2020-03-03 LAB — LIPID PANEL
Chol/HDL Ratio: 3.1 ratio (ref 0.0–4.4)
Cholesterol, Total: 172 mg/dL (ref 100–199)
HDL: 56 mg/dL (ref >39)
LDL Chol Calc (NIH): 97 mg/dL (ref 0–99)
Triglycerides: 103 mg/dL (ref 0–149)
VLDL Cholesterol Cal: 19 mg/dL (ref 5–40)

## 2020-03-03 LAB — CYTOLOGY - PAP
Adequacy: ABSENT
Chlamydia: NEGATIVE
Comment: NEGATIVE
Comment: NEGATIVE
Comment: NEGATIVE
Comment: NORMAL
Diagnosis: NEGATIVE
High risk HPV: POSITIVE — AB
Neisseria Gonorrhea: NEGATIVE
Trichomonas: NEGATIVE

## 2020-03-03 LAB — RPR: RPR Ser Ql: NONREACTIVE

## 2020-03-03 LAB — VITAMIN D 25 HYDROXY (VIT D DEFICIENCY, FRACTURES): Vit D, 25-Hydroxy: 35.8 ng/mL (ref 30.0–100.0)

## 2020-03-03 LAB — HIV ANTIBODY (ROUTINE TESTING W REFLEX): HIV Screen 4th Generation wRfx: NONREACTIVE

## 2020-03-04 ENCOUNTER — Other Ambulatory Visit: Payer: Self-pay | Admitting: Family Medicine

## 2020-03-04 DIAGNOSIS — B3731 Acute candidiasis of vulva and vagina: Secondary | ICD-10-CM

## 2020-03-04 DIAGNOSIS — B9689 Other specified bacterial agents as the cause of diseases classified elsewhere: Secondary | ICD-10-CM

## 2020-03-04 MED ORDER — METRONIDAZOLE 500 MG PO TABS: 500.0000 mg | ORAL_TABLET | Freq: Two times a day (BID) | ORAL | 0 refills | Status: DC

## 2020-03-04 MED ORDER — FLUCONAZOLE 150 MG PO TABS: 150.0000 mg | ORAL_TABLET | Freq: Once | ORAL | 0 refills | Status: AC

## 2020-03-04 NOTE — Progress Notes (Signed)
I sent her a message through my chart but if you would, please call her and let her know that I am sending in an antibiotic and antifungal medication due to bacterial vaginosis and yeast infection seen on her Pap smear.  Also, she was positive for HPV which is passed through sexual contact and increases her risk of developing cervical cancer later in life.  Please refer her to an OB/GYN to follow-up.  Thank you

## 2020-03-05 LAB — URINE CULTURE

## 2020-03-10 DIAGNOSIS — F411 Generalized anxiety disorder: Secondary | ICD-10-CM | POA: Diagnosis not present

## 2020-03-24 DIAGNOSIS — F411 Generalized anxiety disorder: Secondary | ICD-10-CM | POA: Diagnosis not present

## 2020-04-21 DIAGNOSIS — F411 Generalized anxiety disorder: Secondary | ICD-10-CM | POA: Diagnosis not present

## 2020-05-11 DIAGNOSIS — F411 Generalized anxiety disorder: Secondary | ICD-10-CM | POA: Diagnosis not present

## 2020-05-18 ENCOUNTER — Other Ambulatory Visit: Payer: Self-pay

## 2020-05-23 DIAGNOSIS — Z20822 Contact with and (suspected) exposure to covid-19: Secondary | ICD-10-CM | POA: Diagnosis not present

## 2020-05-25 DIAGNOSIS — F411 Generalized anxiety disorder: Secondary | ICD-10-CM | POA: Diagnosis not present

## 2020-05-28 ENCOUNTER — Other Ambulatory Visit: Payer: BLUE CROSS/BLUE SHIELD

## 2020-06-09 DIAGNOSIS — F411 Generalized anxiety disorder: Secondary | ICD-10-CM | POA: Diagnosis not present

## 2020-06-09 DIAGNOSIS — F1021 Alcohol dependence, in remission: Secondary | ICD-10-CM | POA: Diagnosis not present

## 2020-06-29 DIAGNOSIS — F1021 Alcohol dependence, in remission: Secondary | ICD-10-CM | POA: Diagnosis not present

## 2020-06-29 DIAGNOSIS — F411 Generalized anxiety disorder: Secondary | ICD-10-CM | POA: Diagnosis not present

## 2020-07-01 DIAGNOSIS — S81851A Open bite, right lower leg, initial encounter: Secondary | ICD-10-CM | POA: Diagnosis not present

## 2020-07-01 DIAGNOSIS — W57XXXA Bitten or stung by nonvenomous insect and other nonvenomous arthropods, initial encounter: Secondary | ICD-10-CM | POA: Diagnosis not present

## 2020-07-07 DIAGNOSIS — F411 Generalized anxiety disorder: Secondary | ICD-10-CM | POA: Diagnosis not present

## 2020-07-07 DIAGNOSIS — F1021 Alcohol dependence, in remission: Secondary | ICD-10-CM | POA: Diagnosis not present

## 2020-07-21 DIAGNOSIS — F1021 Alcohol dependence, in remission: Secondary | ICD-10-CM | POA: Diagnosis not present

## 2020-07-21 DIAGNOSIS — F411 Generalized anxiety disorder: Secondary | ICD-10-CM | POA: Diagnosis not present

## 2020-08-04 DIAGNOSIS — F1021 Alcohol dependence, in remission: Secondary | ICD-10-CM | POA: Diagnosis not present

## 2020-08-04 DIAGNOSIS — F411 Generalized anxiety disorder: Secondary | ICD-10-CM | POA: Diagnosis not present

## 2020-08-18 DIAGNOSIS — F411 Generalized anxiety disorder: Secondary | ICD-10-CM | POA: Diagnosis not present

## 2020-08-18 DIAGNOSIS — F1021 Alcohol dependence, in remission: Secondary | ICD-10-CM | POA: Diagnosis not present

## 2020-08-27 DIAGNOSIS — F1020 Alcohol dependence, uncomplicated: Secondary | ICD-10-CM | POA: Diagnosis not present

## 2020-09-01 DIAGNOSIS — F1021 Alcohol dependence, in remission: Secondary | ICD-10-CM | POA: Diagnosis not present

## 2020-09-01 DIAGNOSIS — F411 Generalized anxiety disorder: Secondary | ICD-10-CM | POA: Diagnosis not present

## 2020-09-16 DIAGNOSIS — F1020 Alcohol dependence, uncomplicated: Secondary | ICD-10-CM | POA: Diagnosis not present

## 2020-09-28 DIAGNOSIS — Z1152 Encounter for screening for COVID-19: Secondary | ICD-10-CM | POA: Diagnosis not present

## 2020-09-29 DIAGNOSIS — F411 Generalized anxiety disorder: Secondary | ICD-10-CM | POA: Diagnosis not present

## 2020-09-29 DIAGNOSIS — F1021 Alcohol dependence, in remission: Secondary | ICD-10-CM | POA: Diagnosis not present

## 2020-10-13 DIAGNOSIS — F1021 Alcohol dependence, in remission: Secondary | ICD-10-CM | POA: Diagnosis not present

## 2020-10-13 DIAGNOSIS — F411 Generalized anxiety disorder: Secondary | ICD-10-CM | POA: Diagnosis not present

## 2020-10-14 DIAGNOSIS — F1020 Alcohol dependence, uncomplicated: Secondary | ICD-10-CM | POA: Diagnosis not present

## 2020-10-27 DIAGNOSIS — F1021 Alcohol dependence, in remission: Secondary | ICD-10-CM | POA: Diagnosis not present

## 2020-10-27 DIAGNOSIS — F411 Generalized anxiety disorder: Secondary | ICD-10-CM | POA: Diagnosis not present

## 2020-11-10 DIAGNOSIS — F411 Generalized anxiety disorder: Secondary | ICD-10-CM | POA: Diagnosis not present

## 2020-11-10 DIAGNOSIS — F1021 Alcohol dependence, in remission: Secondary | ICD-10-CM | POA: Diagnosis not present

## 2020-11-13 DIAGNOSIS — F419 Anxiety disorder, unspecified: Secondary | ICD-10-CM | POA: Diagnosis not present

## 2020-11-13 DIAGNOSIS — F1020 Alcohol dependence, uncomplicated: Secondary | ICD-10-CM | POA: Diagnosis not present

## 2020-11-13 DIAGNOSIS — F3289 Other specified depressive episodes: Secondary | ICD-10-CM | POA: Diagnosis not present

## 2020-11-13 DIAGNOSIS — L309 Dermatitis, unspecified: Secondary | ICD-10-CM | POA: Diagnosis not present

## 2020-11-17 DIAGNOSIS — F411 Generalized anxiety disorder: Secondary | ICD-10-CM | POA: Diagnosis not present

## 2020-11-17 DIAGNOSIS — F1021 Alcohol dependence, in remission: Secondary | ICD-10-CM | POA: Diagnosis not present

## 2020-11-24 DIAGNOSIS — F1021 Alcohol dependence, in remission: Secondary | ICD-10-CM | POA: Diagnosis not present

## 2020-11-24 DIAGNOSIS — F411 Generalized anxiety disorder: Secondary | ICD-10-CM | POA: Diagnosis not present

## 2020-12-08 DIAGNOSIS — F1021 Alcohol dependence, in remission: Secondary | ICD-10-CM | POA: Diagnosis not present

## 2020-12-08 DIAGNOSIS — F411 Generalized anxiety disorder: Secondary | ICD-10-CM | POA: Diagnosis not present

## 2020-12-11 DIAGNOSIS — L309 Dermatitis, unspecified: Secondary | ICD-10-CM | POA: Diagnosis not present

## 2020-12-11 DIAGNOSIS — F1020 Alcohol dependence, uncomplicated: Secondary | ICD-10-CM | POA: Diagnosis not present

## 2020-12-11 DIAGNOSIS — F3289 Other specified depressive episodes: Secondary | ICD-10-CM | POA: Diagnosis not present

## 2020-12-11 DIAGNOSIS — F419 Anxiety disorder, unspecified: Secondary | ICD-10-CM | POA: Diagnosis not present

## 2020-12-22 DIAGNOSIS — F1021 Alcohol dependence, in remission: Secondary | ICD-10-CM | POA: Diagnosis not present

## 2020-12-22 DIAGNOSIS — F411 Generalized anxiety disorder: Secondary | ICD-10-CM | POA: Diagnosis not present

## 2021-01-06 DIAGNOSIS — F1021 Alcohol dependence, in remission: Secondary | ICD-10-CM | POA: Diagnosis not present

## 2021-01-06 DIAGNOSIS — F411 Generalized anxiety disorder: Secondary | ICD-10-CM | POA: Diagnosis not present

## 2021-01-08 DIAGNOSIS — F3289 Other specified depressive episodes: Secondary | ICD-10-CM | POA: Diagnosis not present

## 2021-01-08 DIAGNOSIS — F419 Anxiety disorder, unspecified: Secondary | ICD-10-CM | POA: Diagnosis not present

## 2021-01-08 DIAGNOSIS — L309 Dermatitis, unspecified: Secondary | ICD-10-CM | POA: Diagnosis not present

## 2021-01-08 DIAGNOSIS — F1020 Alcohol dependence, uncomplicated: Secondary | ICD-10-CM | POA: Diagnosis not present

## 2021-01-20 DIAGNOSIS — F411 Generalized anxiety disorder: Secondary | ICD-10-CM | POA: Diagnosis not present

## 2021-01-20 DIAGNOSIS — F1021 Alcohol dependence, in remission: Secondary | ICD-10-CM | POA: Diagnosis not present

## 2021-01-28 ENCOUNTER — Other Ambulatory Visit: Payer: Self-pay | Admitting: Family Medicine

## 2021-01-28 ENCOUNTER — Encounter: Payer: Self-pay | Admitting: Family Medicine

## 2021-01-28 ENCOUNTER — Ambulatory Visit: Payer: BLUE CROSS/BLUE SHIELD | Admitting: Family Medicine

## 2021-01-28 VITALS — BP 100/70 | HR 72 | Ht 66.0 in | Wt 120.0 lb

## 2021-01-28 DIAGNOSIS — Z113 Encounter for screening for infections with a predominantly sexual mode of transmission: Secondary | ICD-10-CM

## 2021-01-28 DIAGNOSIS — L2089 Other atopic dermatitis: Secondary | ICD-10-CM | POA: Diagnosis not present

## 2021-01-28 DIAGNOSIS — Z1159 Encounter for screening for other viral diseases: Secondary | ICD-10-CM | POA: Diagnosis not present

## 2021-01-28 DIAGNOSIS — J4520 Mild intermittent asthma, uncomplicated: Secondary | ICD-10-CM

## 2021-01-28 DIAGNOSIS — Z202 Contact with and (suspected) exposure to infections with a predominantly sexual mode of transmission: Secondary | ICD-10-CM

## 2021-01-28 MED ORDER — TRIAMCINOLONE ACETONIDE 0.1 % EX CREA
1.0000 | TOPICAL_CREAM | Freq: Two times a day (BID) | CUTANEOUS | 1 refills | Status: DC
Start: 2021-01-28 — End: 2022-05-11

## 2021-01-28 MED ORDER — PROAIR HFA 108 (90 BASE) MCG/ACT IN AERS: 1.0000 | INHALATION_SPRAY | RESPIRATORY_TRACT | 0 refills | Status: DC | PRN

## 2021-01-28 MED ORDER — FLUTICASONE-SALMETEROL 100-50 MCG/ACT IN AEPB: 1.0000 | INHALATION_SPRAY | Freq: Two times a day (BID) | RESPIRATORY_TRACT | 1 refills | Status: DC

## 2021-01-28 NOTE — Patient Instructions (Signed)
Please use condoms regularly.  Schedule an appointment with Vickie (and discuss contraception, as well as your asthma--you are likely due to have spirometry test done).   Eczema Eczema refers to a group of skin conditions that cause skin to become rough and inflamed. Each type of eczema has different triggers, symptoms, and treatments. Eczema of any type is usually itchy. Symptoms range from mild to severe. Eczema is not spread from person to person (is not contagious). It can appear on different parts of the body at different times. One person's eczema may look different from another person's eczema. What are the causes? The exact cause of this condition is not known. However, exposure to certain environmental factors, irritants, and allergens can make the condition worse. What are the signs or symptoms? Symptoms of this condition depend on the type of eczema you have. The types include:  Contact dermatitis. There are two kinds: ? Irritant contact dermatitis. This happens when something irritates the skin and causes a rash. ? Allergic contact dermatitis. This happens when your skin comes in contact with something you are allergic to (allergens). This can include poison ivy, chemicals, or medicines that were applied to your skin.  Atopic dermatitis. This is a long-term (chronic) skin disease that keeps coming back (recurring). It is the most common type of eczema. Usual symptoms are a red rash and itchy, dry, scaly skin. It usually starts showing signs in infancy and can last through adulthood.  Dyshidrotic eczema. This is a form of eczema on the hands and feet. It shows up as very itchy, fluid-filled blisters. It can affect people of any age but is more common before age 38.  Hand eczema. This causes very itchy areas of skin on the palms and sides of the hands and fingers. This type of eczema is common in industrial jobs where you may be exposed to different types of irritants.  Lichen  simplex chronicus. This type of eczema occurs when a person constantly scratches one area of the body. Repeated scratching of the area leads to thickened skin (lichenification). This condition can accompany other types of eczema. It is more common in adults but may also be seen in children.  Nummular eczema. This is a common type of eczema that most often affects the lower legs and the backs of the hands. It typically causes an itchy, red, circular, crusty lesion (plaque). Scratching may become a habit and can cause bleeding. Nummular eczema occurs most often in middle-aged or older people.  Seborrheic dermatitis. This is a common skin disease that mainly affects the scalp. It may also affect other oily areas of the body, such as the face, sides of the nose, eyebrows, ears, eyelids, and chest. It is marked by small scaling and redness of the skin (erythema). This can affect people of all ages. In infants, this condition is called cradle cap.  Stasis dermatitis. This is a common skin disease that can cause itching, scaling, and hyperpigmentation, usually on the legs and feet. It occurs most often in people who have a condition that prevents blood from being pumped through the veins in the legs (chronic venous insufficiency). Stasis dermatitis is a chronic condition that needs long-term management.   How is this diagnosed? This condition may be diagnosed based on:  A physical exam of your skin.  Your medical history.  Skin patch tests. These tests involve using patches that contain possible allergens and placing them on your back. Your health care provider will check in a  few days to see if an allergic reaction occurred. How is this treated? Treatment for eczema is based on the type of eczema you have. You may be given hydrocortisone steroid medicine or antihistamines. These can relieve itching quickly and help reduce inflammation. These may be prescribed or purchased over the counter, depending on the  strength that is needed. Follow these instructions at home:  Take or apply over-the-counter and prescription medicines only as told by your health care provider.  Use creams or ointments to moisturize your skin. Do not use lotions.  Learn what triggers or irritates your symptoms so you can avoid these things.  Treat symptom flare-ups quickly.  Do not scratch your skin. This can make your rash worse.  Keep all follow-up visits. This is important. Where to find more information  American Academy of Dermatology: MarketingSheets.si  National Eczema Association: nationaleczema.org  The Society for Pediatric Dermatology: pedsderm.net Contact a health care provider if:  You have severe itching, even with treatment.  You scratch your skin regularly until it bleeds.  Your rash looks different than usual.  Your skin is painful, swollen, or more red than usual.  You have a fever. Summary  Eczema refers to a group of skin conditions that cause skin to become rough and inflamed. Each type has different triggers.  Eczema of any type causes itching that may range from mild to severe.  Treatment varies based on the type of eczema you have. Hydrocortisone steroid medicine or antihistamines can help with itching and inflammation.  Protecting your skin is the best way to prevent eczema. Use creams or ointments to moisturize your skin. Avoid triggers and irritants. Treat flare-ups quickly. This information is not intended to replace advice given to you by your health care provider. Make sure you discuss any questions you have with your health care provider. Document Revised: 06/15/2020 Document Reviewed: 06/15/2020 Elsevier Patient Education  2021 [employer].

## 2021-01-28 NOTE — Progress Notes (Signed)
Chief Complaint  Patient presents with  . Rash    On arms and behind knees, usually gets rx for steroid cream and it clears up. Using OTC cortisone cream and it is not working. Would also like STD test-current partner positive for chlamydia.    Eczema started flaring a few weeks ago, and continues to spread. She has rash at antecubital fossa, neck, behind knees. This is typical for an eczema flare for her. She has been out of her topical steroids.  Has used TAC in the past, and most recent rx was for halobetasol.  She is also requesting refills of asthma medications. [facility] treatment center last prescribed her inhalers.  Still goes there for psych meds. She stopped taking the Advair, restarted it when things started flaring, but ran out about a month ago. She is requesting refill.  No spirometry on file here.  She is requesting STD testing.  Her current sexual partner had a prior partner call him with +chlamydia.  Partner was tested, + result came back yesterday. +unprotected sex. She denies vaginal discharge, pelvic pain.  LMP 4/15. Not currently using condoms/contraception.    PMH, PSH, SH reviewed  Outpatient Encounter Medications as of 01/28/2021  Medication Sig  . FLUoxetine (PROZAC) 40 MG capsule Take 40 mg by mouth daily.  . Fluticasone-Salmeterol (ADVAIR) 100-50 MCG/DOSE AEPB Inhale 1 puff into the lungs daily.  Marland Kitchen lamoTRIgine (LAMICTAL) 100 MG tablet Take 100 mg by mouth daily.  Marland Kitchen PROAIR HFA 108 (90 Base) MCG/ACT inhaler Inhale 1-2 puffs into the lungs every 4 (four) hours as needed for shortness of breath.  . propranolol ER (INDERAL LA) 60 MG 24 hr capsule Take 60 mg by mouth daily.  . traZODone (DESYREL) 50 MG tablet Take 50 mg by mouth at bedtime as needed for sleep.  . diphenhydrAMINE (BENADRYL) 25 MG tablet Take 50 mg by mouth once. (Patient not taking: Reported on 01/28/2021)  . EPINEPHrine 0.3 mg/0.3 mL IJ SOAJ injection Inject 0.3 mg into the muscle as needed for  anaphylaxis. (Patient not taking: Reported on 01/28/2021)  . halobetasol (ULTRAVATE) 0.05 % cream Apply 1 application topically daily as needed (eczema). (Patient not taking: Reported on 01/28/2021)  . ibuprofen (ADVIL,MOTRIN) 600 MG tablet Take 600 mg by mouth every 6 (six) hours as needed for headache, moderate pain or cramping. (Patient not taking: Reported on 01/28/2021)  . [DISCONTINUED] clotrimazole (LOTRIMIN) 1 % cream Apply 1 application topically daily as needed (rash).  . [DISCONTINUED] hydrocortisone 2.5 % cream Apply 1 application topically daily as needed (itching).  . [DISCONTINUED] metroNIDAZOLE (FLAGYL) 500 MG tablet Take 1 tablet (500 mg total) by mouth 2 (two) times daily.  . [DISCONTINUED] propranolol (INDERAL) 20 MG tablet Take 20 mg by mouth 3 (three) times daily.  . [DISCONTINUED] sulfamethoxazole-trimethoprim (BACTRIM DS) 800-160 MG tablet Take 1 tablet by mouth 2 (two) times daily.   No facility-administered encounter medications on file as of 01/28/2021.   Allergies  Allergen Reactions  . Other Anaphylaxis    Allergy to all nuts  . Shellfish Allergy Anaphylaxis  . Molds & Smuts   . Peanut Oil    ROS: no fever, chills, URI symptoms, chest pain. Denies abdominal pain, n/v/d, vaginal discharge. Slight wheezing and allergies recently Eczema flare per HPI.  No other rashes. See HPI   PHYSICAL EXAM:  BP 100/70   Pulse 72   Ht 5\' 6"  (1.676 m)   Wt 120 lb (54.4 kg)   LMP 01/01/2021   BMI 19.37  kg/m   Well-appearing, thin, pleasant female in good spirits HEENT: conjunctiva and sclera, wearing mask Neck: no lymphadenopathy or mass Heart: regular rate and rhythm Lungs: clear bilaterally, no wheezing noted, good air movement Abdomen: soft, nontender, no mass Extremities: no edema Psych: normal mood, affect, grooming Neuro: alert and oriented, normal gait   ASSESSMENT/PLAN:   Flexural atopic dermatitis - TAC 0.1% BID prn; moisturize.  f/u if persists/worsens -  Plan: triamcinolone cream (KENALOG) 0.1 %  Mild intermittent asthma without complication - refilled advair and albuterol.  Needs to f/u with PCP, rec spirometry. - Plan: fluticasone-salmeterol (ADVAIR) 100-50 MCG/ACT AEPB, DISCONTINUED: PROAIR HFA 108 (90 Base) MCG/ACT inhaler  Exposure to chlamydia - tested today; advised of rec ABX if positive. Recommended abstaining until both treated, and regular use of condoms - Plan: GC/Chlamydia Probe Amp  Screen for STD (sexually transmitted disease) - Plan: Hepatitis C antibody, RPR, HIV Antibody (routine testing w rflx), GC/Chlamydia Probe Amp, Hepatitis B surface antigen  Need for hepatitis C screening test - Plan: Hepatitis C antibody  Patient to f/u with Vickie for CPE, f/u asthma (with spirometry)  HIV, RPR, Hep C Ab, hep B Ag Urine GC/chlamydia  Declines COVID booster today.

## 2021-01-28 NOTE — Telephone Encounter (Signed)
Changed to generic ventolin

## 2021-01-28 NOTE — Telephone Encounter (Signed)
Is this change ok? 

## 2021-02-01 LAB — GC/CHLAMYDIA PROBE AMP
Chlamydia trachomatis, NAA: POSITIVE — AB
Neisseria Gonorrhoeae by PCR: NEGATIVE

## 2021-02-02 ENCOUNTER — Encounter: Payer: Self-pay | Admitting: Family Medicine

## 2021-02-02 DIAGNOSIS — A749 Chlamydial infection, unspecified: Secondary | ICD-10-CM

## 2021-02-02 LAB — HIV ANTIBODY (ROUTINE TESTING W REFLEX): HIV Screen 4th Generation wRfx: NONREACTIVE

## 2021-02-02 LAB — RPR: RPR Ser Ql: NONREACTIVE

## 2021-02-02 LAB — HEPATITIS C ANTIBODY: Hep C Virus Ab: <0.1 s/co ratio (ref 0.0–0.9)

## 2021-02-02 LAB — HEPATITIS B SURFACE ANTIGEN: Hepatitis B Surface Ag: NEGATIVE

## 2021-02-02 MED ORDER — AZITHROMYCIN 500 MG PO TABS
1000.0000 mg | ORAL_TABLET | Freq: Once | ORAL | 0 refills | Status: AC
Start: 2021-02-02 — End: 2021-02-02

## 2021-02-05 DIAGNOSIS — F419 Anxiety disorder, unspecified: Secondary | ICD-10-CM | POA: Diagnosis not present

## 2021-02-05 DIAGNOSIS — F1020 Alcohol dependence, uncomplicated: Secondary | ICD-10-CM | POA: Diagnosis not present

## 2021-02-17 DIAGNOSIS — F411 Generalized anxiety disorder: Secondary | ICD-10-CM | POA: Diagnosis not present

## 2021-02-17 DIAGNOSIS — F1021 Alcohol dependence, in remission: Secondary | ICD-10-CM | POA: Diagnosis not present

## 2021-03-03 DIAGNOSIS — F1021 Alcohol dependence, in remission: Secondary | ICD-10-CM | POA: Diagnosis not present

## 2021-03-03 DIAGNOSIS — F411 Generalized anxiety disorder: Secondary | ICD-10-CM | POA: Diagnosis not present

## 2021-03-05 DIAGNOSIS — F1020 Alcohol dependence, uncomplicated: Secondary | ICD-10-CM | POA: Diagnosis not present

## 2021-03-05 DIAGNOSIS — F419 Anxiety disorder, unspecified: Secondary | ICD-10-CM | POA: Diagnosis not present

## 2021-03-17 DIAGNOSIS — F1021 Alcohol dependence, in remission: Secondary | ICD-10-CM | POA: Diagnosis not present

## 2021-03-17 DIAGNOSIS — F411 Generalized anxiety disorder: Secondary | ICD-10-CM | POA: Diagnosis not present

## 2021-03-19 ENCOUNTER — Encounter: Payer: BLUE CROSS/BLUE SHIELD | Admitting: Family Medicine

## 2021-04-14 DIAGNOSIS — F1021 Alcohol dependence, in remission: Secondary | ICD-10-CM | POA: Diagnosis not present

## 2021-04-14 DIAGNOSIS — F411 Generalized anxiety disorder: Secondary | ICD-10-CM | POA: Diagnosis not present

## 2021-04-16 DIAGNOSIS — F419 Anxiety disorder, unspecified: Secondary | ICD-10-CM | POA: Diagnosis not present

## 2021-04-16 DIAGNOSIS — F3289 Other specified depressive episodes: Secondary | ICD-10-CM | POA: Diagnosis not present

## 2021-04-20 DIAGNOSIS — F1021 Alcohol dependence, in remission: Secondary | ICD-10-CM | POA: Diagnosis not present

## 2021-04-20 DIAGNOSIS — F411 Generalized anxiety disorder: Secondary | ICD-10-CM | POA: Diagnosis not present

## 2021-05-14 DIAGNOSIS — F1020 Alcohol dependence, uncomplicated: Secondary | ICD-10-CM | POA: Diagnosis not present

## 2021-05-14 DIAGNOSIS — F419 Anxiety disorder, unspecified: Secondary | ICD-10-CM | POA: Diagnosis not present

## 2021-05-26 DIAGNOSIS — F411 Generalized anxiety disorder: Secondary | ICD-10-CM | POA: Diagnosis not present

## 2021-05-26 DIAGNOSIS — F1021 Alcohol dependence, in remission: Secondary | ICD-10-CM | POA: Diagnosis not present

## 2021-06-24 DIAGNOSIS — F1021 Alcohol dependence, in remission: Secondary | ICD-10-CM | POA: Diagnosis not present

## 2021-06-24 DIAGNOSIS — F411 Generalized anxiety disorder: Secondary | ICD-10-CM | POA: Diagnosis not present

## 2021-07-06 DIAGNOSIS — F1021 Alcohol dependence, in remission: Secondary | ICD-10-CM | POA: Diagnosis not present

## 2021-07-06 DIAGNOSIS — F411 Generalized anxiety disorder: Secondary | ICD-10-CM | POA: Diagnosis not present

## 2021-07-07 DIAGNOSIS — F419 Anxiety disorder, unspecified: Secondary | ICD-10-CM | POA: Diagnosis not present

## 2021-07-07 DIAGNOSIS — F3289 Other specified depressive episodes: Secondary | ICD-10-CM | POA: Diagnosis not present

## 2021-07-07 DIAGNOSIS — F1020 Alcohol dependence, uncomplicated: Secondary | ICD-10-CM | POA: Diagnosis not present

## 2021-07-31 ENCOUNTER — Other Ambulatory Visit: Payer: Self-pay | Admitting: Family Medicine

## 2021-07-31 DIAGNOSIS — J4520 Mild intermittent asthma, uncomplicated: Secondary | ICD-10-CM

## 2021-08-02 NOTE — Telephone Encounter (Signed)
Is this ok to refill?  

## 2021-08-06 DIAGNOSIS — F1020 Alcohol dependence, uncomplicated: Secondary | ICD-10-CM | POA: Diagnosis not present

## 2021-08-06 DIAGNOSIS — F3289 Other specified depressive episodes: Secondary | ICD-10-CM | POA: Diagnosis not present

## 2021-08-06 DIAGNOSIS — F419 Anxiety disorder, unspecified: Secondary | ICD-10-CM | POA: Diagnosis not present

## 2021-08-18 DIAGNOSIS — F411 Generalized anxiety disorder: Secondary | ICD-10-CM | POA: Diagnosis not present

## 2021-08-18 DIAGNOSIS — F1021 Alcohol dependence, in remission: Secondary | ICD-10-CM | POA: Diagnosis not present

## 2021-08-31 ENCOUNTER — Other Ambulatory Visit: Payer: Self-pay | Admitting: Family Medicine

## 2021-08-31 DIAGNOSIS — J4520 Mild intermittent asthma, uncomplicated: Secondary | ICD-10-CM

## 2021-08-31 NOTE — Telephone Encounter (Signed)
[facility] is requesting to fill pt ventolin. Please advise [facility] ?

## 2021-09-03 DIAGNOSIS — F3289 Other specified depressive episodes: Secondary | ICD-10-CM | POA: Diagnosis not present

## 2021-09-03 DIAGNOSIS — F1020 Alcohol dependence, uncomplicated: Secondary | ICD-10-CM | POA: Diagnosis not present

## 2021-09-03 DIAGNOSIS — F419 Anxiety disorder, unspecified: Secondary | ICD-10-CM | POA: Diagnosis not present

## 2021-09-15 DIAGNOSIS — F411 Generalized anxiety disorder: Secondary | ICD-10-CM | POA: Diagnosis not present

## 2021-09-15 DIAGNOSIS — F1021 Alcohol dependence, in remission: Secondary | ICD-10-CM | POA: Diagnosis not present

## 2021-09-17 DIAGNOSIS — F1020 Alcohol dependence, uncomplicated: Secondary | ICD-10-CM | POA: Diagnosis not present

## 2021-09-17 DIAGNOSIS — F419 Anxiety disorder, unspecified: Secondary | ICD-10-CM | POA: Diagnosis not present

## 2021-10-01 DIAGNOSIS — F3289 Other specified depressive episodes: Secondary | ICD-10-CM | POA: Diagnosis not present

## 2021-10-01 DIAGNOSIS — F419 Anxiety disorder, unspecified: Secondary | ICD-10-CM | POA: Diagnosis not present

## 2021-10-01 DIAGNOSIS — F1020 Alcohol dependence, uncomplicated: Secondary | ICD-10-CM | POA: Diagnosis not present

## 2021-10-15 DIAGNOSIS — F419 Anxiety disorder, unspecified: Secondary | ICD-10-CM | POA: Diagnosis not present

## 2021-10-15 DIAGNOSIS — F1020 Alcohol dependence, uncomplicated: Secondary | ICD-10-CM | POA: Diagnosis not present

## 2021-10-26 ENCOUNTER — Other Ambulatory Visit: Payer: Self-pay | Admitting: Family Medicine

## 2021-10-26 DIAGNOSIS — J4520 Mild intermittent asthma, uncomplicated: Secondary | ICD-10-CM

## 2021-10-27 NOTE — Telephone Encounter (Signed)
[facility] is requesting to fill pt ventolin. Please advise [facility] ?

## 2021-10-28 NOTE — Telephone Encounter (Signed)
Lvm for pt to call back to schedule with Molly Maduro PA

## 2021-10-29 DIAGNOSIS — F3289 Other specified depressive episodes: Secondary | ICD-10-CM | POA: Diagnosis not present

## 2021-10-29 DIAGNOSIS — F1020 Alcohol dependence, uncomplicated: Secondary | ICD-10-CM | POA: Diagnosis not present

## 2021-10-29 DIAGNOSIS — F419 Anxiety disorder, unspecified: Secondary | ICD-10-CM | POA: Diagnosis not present

## 2021-10-29 NOTE — Telephone Encounter (Signed)
Lvm for pt advising we will have to refuse med until we see her. [place]

## 2021-11-05 DIAGNOSIS — F1020 Alcohol dependence, uncomplicated: Secondary | ICD-10-CM | POA: Diagnosis not present

## 2021-11-05 DIAGNOSIS — F419 Anxiety disorder, unspecified: Secondary | ICD-10-CM | POA: Diagnosis not present

## 2021-11-05 DIAGNOSIS — F3289 Other specified depressive episodes: Secondary | ICD-10-CM | POA: Diagnosis not present

## 2021-11-12 DIAGNOSIS — F1020 Alcohol dependence, uncomplicated: Secondary | ICD-10-CM | POA: Diagnosis not present

## 2021-11-12 DIAGNOSIS — F419 Anxiety disorder, unspecified: Secondary | ICD-10-CM | POA: Diagnosis not present

## 2021-11-12 DIAGNOSIS — F3289 Other specified depressive episodes: Secondary | ICD-10-CM | POA: Diagnosis not present

## 2021-11-19 DIAGNOSIS — F3289 Other specified depressive episodes: Secondary | ICD-10-CM | POA: Diagnosis not present

## 2021-11-19 DIAGNOSIS — F1020 Alcohol dependence, uncomplicated: Secondary | ICD-10-CM | POA: Diagnosis not present

## 2021-11-24 DIAGNOSIS — F3289 Other specified depressive episodes: Secondary | ICD-10-CM | POA: Diagnosis not present

## 2021-11-24 DIAGNOSIS — F1020 Alcohol dependence, uncomplicated: Secondary | ICD-10-CM | POA: Diagnosis not present

## 2021-12-02 DIAGNOSIS — F1020 Alcohol dependence, uncomplicated: Secondary | ICD-10-CM | POA: Diagnosis not present

## 2021-12-08 DIAGNOSIS — F419 Anxiety disorder, unspecified: Secondary | ICD-10-CM | POA: Diagnosis not present

## 2021-12-08 DIAGNOSIS — F3289 Other specified depressive episodes: Secondary | ICD-10-CM | POA: Diagnosis not present

## 2021-12-08 DIAGNOSIS — F1020 Alcohol dependence, uncomplicated: Secondary | ICD-10-CM | POA: Diagnosis not present

## 2021-12-17 DIAGNOSIS — F419 Anxiety disorder, unspecified: Secondary | ICD-10-CM | POA: Diagnosis not present

## 2021-12-17 DIAGNOSIS — F1020 Alcohol dependence, uncomplicated: Secondary | ICD-10-CM | POA: Diagnosis not present

## 2021-12-24 DIAGNOSIS — F1020 Alcohol dependence, uncomplicated: Secondary | ICD-10-CM | POA: Diagnosis not present

## 2021-12-24 DIAGNOSIS — F419 Anxiety disorder, unspecified: Secondary | ICD-10-CM | POA: Diagnosis not present

## 2021-12-31 DIAGNOSIS — F419 Anxiety disorder, unspecified: Secondary | ICD-10-CM | POA: Diagnosis not present

## 2021-12-31 DIAGNOSIS — F1020 Alcohol dependence, uncomplicated: Secondary | ICD-10-CM | POA: Diagnosis not present

## 2022-01-03 ENCOUNTER — Ambulatory Visit: Payer: BC Managed Care – PPO | Admitting: Physician Assistant

## 2022-01-03 ENCOUNTER — Encounter: Payer: Self-pay | Admitting: Physician Assistant

## 2022-01-03 VITALS — BP 110/70 | HR 86 | Ht 66.0 in | Wt 119.0 lb

## 2022-01-03 DIAGNOSIS — Z0000 Encounter for general adult medical examination without abnormal findings: Secondary | ICD-10-CM | POA: Diagnosis not present

## 2022-01-03 DIAGNOSIS — J4520 Mild intermittent asthma, uncomplicated: Secondary | ICD-10-CM | POA: Diagnosis not present

## 2022-01-03 DIAGNOSIS — R222 Localized swelling, mass and lump, trunk: Secondary | ICD-10-CM

## 2022-01-03 DIAGNOSIS — Z681 Body mass index (BMI) 19 or less, adult: Secondary | ICD-10-CM | POA: Insufficient documentation

## 2022-01-03 MED ORDER — ALBUTEROL SULFATE HFA 108 (90 BASE) MCG/ACT IN AERS: 2.0000 | INHALATION_SPRAY | Freq: Four times a day (QID) | RESPIRATORY_TRACT | 1 refills | Status: DC | PRN

## 2022-01-03 NOTE — Patient Instructions (Signed)

## 2022-01-03 NOTE — Assessment & Plan Note (Signed)
Stable, will monitor 

## 2022-01-03 NOTE — Progress Notes (Signed)
? ?Complete physical exam ? ? ?Patient: Sharon Freeman   DOB: [birth year]   31 y.o. Female  MRN: [id number] ?Visit Date: 01/03/2022 ? ?Chief Complaint  ?Patient presents with  ? Annual Exam  ?  Non Fasting CPE. She also has a knot on her chest.  ? ?Subjective  ?  ?Sharon Freeman is a 31 y.o. female who presents today for a complete physical exam.  ? ?Reports is generally feeling well; reports a knot on the left side of her chest for a few months; denies injury or trauma; mild pain and has not taken any OTC pain medicine for it. ? ?HPI ?HPI   ? ? Annual Exam   ? Additional comments: Non Fasting CPE. She also has a knot on her chest. ? ?  ?  ?Last edited by Deforest Hoyles, Carter on 01/03/2022  1:45 PM.  ?  ?  ? ? ?Past Medical History:  ?Diagnosis Date  ? Asthma   ? ?No past surgical history on file. ?Social History  ? ?Socioeconomic History  ? Marital status: Single  ?  Spouse name: Not on file  ? Number of children: Not on file  ? Years of education: Not on file  ? Highest education level: Not on file  ?Occupational History  ? Not on file  ?Tobacco Use  ? Smoking status: Former  ?  Packs/day: 1.00  ?  Types: Cigarettes  ?  Quit date: 10/20/2020  ?  Years since quitting: 1.2  ? Smokeless tobacco: Never  ?Substance and Sexual Activity  ? Alcohol use: Not Currently  ?  Comment: sober since 09/02/2018  ? Drug use: Not Currently  ? Sexual activity: Not Currently  ?Other Topics Concern  ? Not on file  ?Social History Narrative  ? Not on file  ? ?Social Determinants of Health  ? ?Financial Resource Strain: Not on file  ?Food Insecurity: Not on file  ?Transportation Needs: Not on file  ?Physical Activity: Not on file  ?Stress: Not on file  ?Social Connections: Not on file  ?Intimate Partner Violence: Not on file  ? ?Family Status  ?Relation Name Status  ? Mother  Alive  ? Father  Alive  ? Sister  Alive  ? Sister  Alive  ? MGM  Alive  ? MGF  Deceased  ? PGM  Deceased  ? PGF  Deceased  ? Neg Hx  (Not Specified)  ? ?Family  History  ?Problem Relation Age of Onset  ? Migraines Mother   ? Hyperlipidemia Mother   ? Heart attack Maternal Grandfather   ? Colon cancer Neg Hx   ? Breast cancer Neg Hx   ? Diabetes type II Neg Hx   ? CVA Neg Hx   ? Hypertension Neg Hx   ? ?Allergies  ?Allergen Reactions  ? Other Anaphylaxis  ?  Allergy to all nuts  ? Shellfish Allergy Anaphylaxis  ? Molds & Smuts   ? Peanut Oil   ?  ?Patient Care Team: ?Marcellina Millin as PCP - General (Physician Assistant)  ? ?Medications: ?Outpatient Medications Prior to Visit  ?Medication Sig  ? ADVAIR DISKUS 100-50 MCG/ACT AEPB INHALE 1 PUFF INTO THE LUNGS TWICE A DAY  ? EPINEPHrine 0.3 mg/0.3 mL IJ SOAJ injection Inject 0.3 mg into the muscle as needed for anaphylaxis.  ? FLUoxetine (PROZAC) 40 MG capsule Take 40 mg by mouth daily.  ? lamoTRIgine (LAMICTAL) 100 MG tablet Take 100 mg by mouth daily.  ? propranolol ER (  INDERAL LA) 60 MG 24 hr capsule Take 60 mg by mouth daily.  ? traZODone (DESYREL) 50 MG tablet Take 50 mg by mouth at bedtime as needed for sleep.  ? triamcinolone cream (KENALOG) 0.1 % Apply 1 application topically 2 (two) times daily.  ? [DISCONTINUED] VENTOLIN HFA 108 (90 Base) MCG/ACT inhaler TAKE 2 PUFFS BY MOUTH EVERY 6 HOURS AS NEEDED FOR WHEEZE OR SHORTNESS OF BREATH  ? [DISCONTINUED] diphenhydrAMINE (BENADRYL) 25 MG tablet Take 50 mg by mouth once. (Patient not taking: Reported on 01/28/2021)  ? [DISCONTINUED] halobetasol (ULTRAVATE) 0.05 % cream Apply 1 application topically daily as needed (eczema). (Patient not taking: Reported on 01/28/2021)  ? [DISCONTINUED] ibuprofen (ADVIL,MOTRIN) 600 MG tablet Take 600 mg by mouth every 6 (six) hours as needed for headache, moderate pain or cramping. (Patient not taking: Reported on 01/28/2021)  ? ?No facility-administered medications prior to visit.  ? ? ?Review of Systems  ?Constitutional:  Negative for activity change and fever.  ?HENT:  Negative for congestion, ear pain and voice change.   ?Eyes:   Negative for redness.  ?Respiratory:  Negative for cough.   ?Cardiovascular:  Negative for chest pain.  ?Gastrointestinal:  Negative for constipation and diarrhea.  ?Endocrine: Negative for polyuria.  ?Genitourinary:  Negative for flank pain.  ?Musculoskeletal:  Negative for gait problem and neck stiffness.  ?Skin:  Negative for color change, pallor, rash and wound.  ?Neurological:  Negative for dizziness.  ?Hematological:  Negative for adenopathy.  ?Psychiatric/Behavioral:  Negative for agitation, behavioral problems and confusion.   ? ?Last CBC ?Lab Results  ?Component Value Date  ? WBC 5.8 12/19/2019  ? HGB 14.7 12/19/2019  ? HCT 42.9 12/19/2019  ? MCV 93 12/19/2019  ? MCH 32.0 12/19/2019  ? RDW 11.7 12/19/2019  ? PLT 292 12/19/2019  ? ?Last metabolic panel ?Lab Results  ?Component Value Date  ? GLUCOSE 84 03/02/2020  ? NA 138 03/02/2020  ? K 4.6 03/02/2020  ? CL 100 03/02/2020  ? CO2 22 03/02/2020  ? BUN 14 03/02/2020  ? CREATININE 0.91 03/02/2020  ? GFRNONAA 84 03/02/2020  ? CALCIUM 9.8 03/02/2020  ? PROT 7.5 03/02/2020  ? ALBUMIN 5.1 (H) 03/02/2020  ? LABGLOB 2.4 03/02/2020  ? AGRATIO 2.1 03/02/2020  ? BILITOT 0.4 03/02/2020  ? ALKPHOS 50 03/02/2020  ? AST 14 03/02/2020  ? ALT 9 03/02/2020  ? ANIONGAP 7 11/24/2018  ? ?Last lipids ?Lab Results  ?Component Value Date  ? CHOL 172 03/02/2020  ? HDL 56 03/02/2020  ? [place] 97 03/02/2020  ? TRIG 103 03/02/2020  ? CHOLHDL 3.1 03/02/2020  ? ?Last hemoglobin A1c ?No results found for: HGBA1C ?Last thyroid functions ?Lab Results  ?Component Value Date  ? TSH 1.110 12/19/2019  ? T3TOTAL 81 12/19/2019  ? ?Last vitamin D ?Lab Results  ?Component Value Date  ? VD25OH 35.8 03/02/2020  ? ?Last vitamin B12 and Folate ?Lab Results  ?Component Value Date  ? [id number] 12/19/2019  ? ?  ? ?The ASCVD Risk score (Arnett DK, et al., 2019) failed to calculate for the following reasons: ?  The 2019 ASCVD risk score is only valid for ages 54 to 64 ? ? Objective  ?  ?BP 110/70    Pulse 86   Ht 5\' 6"  (1.676 m)   Wt 119 lb (54 kg)   LMP 12/13/2021   SpO2 99%   BMI 19.21 kg/m?  ? ?  ? ? ?Physical Exam ?Vitals and nursing note reviewed.  ?  Constitutional:   ?   General: She is not in acute distress. ?   Appearance: Normal appearance. She is not ill-appearing.  ?HENT:  ?   Head: Normocephalic and atraumatic.  ?   Right Ear: Tympanic membrane, ear canal and external ear normal.  ?   Left Ear: Tympanic membrane, ear canal and external ear normal.  ?   Nose: No congestion.  ?Eyes:  ?   Extraocular Movements: Extraocular movements intact.  ?   Conjunctiva/sclera: Conjunctivae normal.  ?   Pupils: Pupils are equal, round, and reactive to light.  ?Neck:  ?   Vascular: No carotid bruit.  ?Cardiovascular:  ?   Rate and Rhythm: Normal rate and regular rhythm.  ?   Pulses: Normal pulses.  ?   Heart sounds: Normal heart sounds.  ?Pulmonary:  ?   Effort: Pulmonary effort is normal.  ?   Breath sounds: Normal breath sounds. No wheezing.  ?Chest:  ? ? ?   Comments: Slightly raised mass over left ribs; minimal tenderness to palpation; no erythema; no edema; no warmth ?Abdominal:  ?   General: Bowel sounds are normal.  ?   Palpations: Abdomen is soft.  ?Musculoskeletal:     ?   General: Normal range of motion.  ?   Cervical back: Normal range of motion and neck supple.  ?   Right lower leg: No edema.  ?   Left lower leg: No edema.  ?Lymphadenopathy:  ?   Upper Body:  ?   Right upper body: No supraclavicular adenopathy.  ?   Left upper body: No supraclavicular adenopathy.  ?Skin: ?   General: Skin is warm and dry.  ?   Findings: No bruising.  ?Neurological:  ?   General: No focal deficit present.  ?   Mental Status: She is alert and oriented to person, place, and time.  ?Psychiatric:     ?   Mood and Affect: Mood normal.     ?   Behavior: Behavior normal.     ?   Thought Content: Thought content normal.  ?  ? ? ?Last depression screening scores ? ?  01/03/2022  ?  1:47 PM 03/02/2020  ?  8:24 AM  ?PHQ 2/9 Scores   ?PHQ - 2 Score 0 0  ? ?Last fall risk screening ? ?  01/03/2022  ?  1:47 PM  ?Fall Risk   ?Falls in the past year? 0  ?Number falls in past yr: 0  ?Injury with Fall? 0  ?Risk for fall due to : No Fall Risks  ?Follow

## 2022-01-03 NOTE — Assessment & Plan Note (Signed)
Stable, continue Advair and albuterol prn ?

## 2022-01-04 LAB — COMPREHENSIVE METABOLIC PANEL
ALT: 14 IU/L (ref 0–32)
AST: 14 IU/L (ref 0–40)
Albumin/Globulin Ratio: 2 (ref 1.2–2.2)
Albumin: 4.9 g/dL — ABNORMAL HIGH (ref 3.8–4.8)
Alkaline Phosphatase: 48 IU/L (ref 44–121)
BUN/Creatinine Ratio: 11 (ref 9–23)
BUN: 9 mg/dL (ref 6–20)
Bilirubin Total: 0.3 mg/dL (ref 0.0–1.2)
CO2: 19 mmol/L — ABNORMAL LOW (ref 20–29)
Calcium: 10.6 mg/dL — ABNORMAL HIGH (ref 8.7–10.2)
Chloride: 101 mmol/L (ref 96–106)
Creatinine, Ser: 0.82 mg/dL (ref 0.57–1.00)
Globulin, Total: 2.5 g/dL (ref 1.5–4.5)
Glucose: 90 mg/dL (ref 70–99)
Potassium: 4.4 mmol/L (ref 3.5–5.2)
Sodium: 140 mmol/L (ref 134–144)
Total Protein: 7.4 g/dL (ref 6.0–8.5)
eGFR: 97 mL/min/{1.73_m2} (ref >59)

## 2022-01-04 LAB — CBC WITH DIFFERENTIAL/PLATELET
Basophils Absolute: 0.1 10*3/uL (ref 0.0–0.2)
Basos: 1 %
EOS (ABSOLUTE): 0.4 10*3/uL (ref 0.0–0.4)
Eos: 5 %
Hematocrit: 38.8 % (ref 34.0–46.6)
Hemoglobin: 13.3 g/dL (ref 11.1–15.9)
Immature Grans (Abs): 0 10*3/uL (ref 0.0–0.1)
Immature Granulocytes: 0 %
Lymphocytes Absolute: 1.3 10*3/uL (ref 0.7–3.1)
Lymphs: 17 %
MCH: 30.9 pg (ref 26.6–33.0)
MCHC: 34.3 g/dL (ref 31.5–35.7)
MCV: 90 fL (ref 79–97)
Monocytes Absolute: 0.5 10*3/uL (ref 0.1–0.9)
Monocytes: 7 %
Neutrophils Absolute: 5.2 10*3/uL (ref 1.4–7.0)
Neutrophils: 70 %
Platelets: 236 10*3/uL (ref 150–450)
RBC: 4.31 x10E6/uL (ref 3.77–5.28)
RDW: 11.6 % — ABNORMAL LOW (ref 11.7–15.4)
WBC: 7.5 10*3/uL (ref 3.4–10.8)

## 2022-01-04 LAB — LIPID PANEL
Chol/HDL Ratio: 2.7 ratio (ref 0.0–4.4)
Cholesterol, Total: 174 mg/dL (ref 100–199)
HDL: 64 mg/dL (ref >39)
LDL Chol Calc (NIH): 93 mg/dL (ref 0–99)
Triglycerides: 95 mg/dL (ref 0–149)
VLDL Cholesterol Cal: 17 mg/dL (ref 5–40)

## 2022-01-05 ENCOUNTER — Ambulatory Visit
Admission: RE | Admit: 2022-01-05 | Discharge: 2022-01-05 | Disposition: A | Discharge status: Home / Self Care | Payer: BC Managed Care – PPO | Source: Ambulatory Visit | Attending: Physician Assistant | Admitting: Physician Assistant

## 2022-01-05 ENCOUNTER — Other Ambulatory Visit: Payer: Self-pay | Admitting: Physician Assistant

## 2022-01-05 DIAGNOSIS — R0789 Other chest pain: Secondary | ICD-10-CM | POA: Diagnosis not present

## 2022-01-05 DIAGNOSIS — J45909 Unspecified asthma, uncomplicated: Secondary | ICD-10-CM | POA: Diagnosis not present

## 2022-01-05 DIAGNOSIS — R222 Localized swelling, mass and lump, trunk: Secondary | ICD-10-CM

## 2022-01-07 DIAGNOSIS — F419 Anxiety disorder, unspecified: Secondary | ICD-10-CM | POA: Diagnosis not present

## 2022-01-07 DIAGNOSIS — F1020 Alcohol dependence, uncomplicated: Secondary | ICD-10-CM | POA: Diagnosis not present

## 2022-01-14 DIAGNOSIS — F1020 Alcohol dependence, uncomplicated: Secondary | ICD-10-CM | POA: Diagnosis not present

## 2022-01-14 DIAGNOSIS — F419 Anxiety disorder, unspecified: Secondary | ICD-10-CM | POA: Diagnosis not present

## 2022-01-21 DIAGNOSIS — F419 Anxiety disorder, unspecified: Secondary | ICD-10-CM | POA: Diagnosis not present

## 2022-01-21 DIAGNOSIS — F1020 Alcohol dependence, uncomplicated: Secondary | ICD-10-CM | POA: Diagnosis not present

## 2022-01-28 DIAGNOSIS — F419 Anxiety disorder, unspecified: Secondary | ICD-10-CM | POA: Diagnosis not present

## 2022-01-28 DIAGNOSIS — F1020 Alcohol dependence, uncomplicated: Secondary | ICD-10-CM | POA: Diagnosis not present

## 2022-02-11 DIAGNOSIS — F1020 Alcohol dependence, uncomplicated: Secondary | ICD-10-CM | POA: Diagnosis not present

## 2022-02-11 DIAGNOSIS — F419 Anxiety disorder, unspecified: Secondary | ICD-10-CM | POA: Diagnosis not present

## 2022-02-15 DIAGNOSIS — F411 Generalized anxiety disorder: Secondary | ICD-10-CM | POA: Diagnosis not present

## 2022-02-15 DIAGNOSIS — F1021 Alcohol dependence, in remission: Secondary | ICD-10-CM | POA: Diagnosis not present

## 2022-03-01 DIAGNOSIS — F411 Generalized anxiety disorder: Secondary | ICD-10-CM | POA: Diagnosis not present

## 2022-03-01 DIAGNOSIS — F1021 Alcohol dependence, in remission: Secondary | ICD-10-CM | POA: Diagnosis not present

## 2022-03-08 DIAGNOSIS — F1021 Alcohol dependence, in remission: Secondary | ICD-10-CM | POA: Diagnosis not present

## 2022-03-08 DIAGNOSIS — F411 Generalized anxiety disorder: Secondary | ICD-10-CM | POA: Diagnosis not present

## 2022-03-11 DIAGNOSIS — F419 Anxiety disorder, unspecified: Secondary | ICD-10-CM | POA: Diagnosis not present

## 2022-03-11 DIAGNOSIS — F1020 Alcohol dependence, uncomplicated: Secondary | ICD-10-CM | POA: Diagnosis not present

## 2022-03-24 DIAGNOSIS — F411 Generalized anxiety disorder: Secondary | ICD-10-CM | POA: Diagnosis not present

## 2022-03-24 DIAGNOSIS — F1021 Alcohol dependence, in remission: Secondary | ICD-10-CM | POA: Diagnosis not present

## 2022-04-05 DIAGNOSIS — F1021 Alcohol dependence, in remission: Secondary | ICD-10-CM | POA: Diagnosis not present

## 2022-04-05 DIAGNOSIS — F411 Generalized anxiety disorder: Secondary | ICD-10-CM | POA: Diagnosis not present

## 2022-04-16 ENCOUNTER — Other Ambulatory Visit: Payer: Self-pay | Admitting: Physician Assistant

## 2022-04-16 DIAGNOSIS — J4520 Mild intermittent asthma, uncomplicated: Secondary | ICD-10-CM

## 2022-04-18 NOTE — Telephone Encounter (Signed)
Left message for patient to return my call.

## 2022-04-18 NOTE — Telephone Encounter (Signed)
We received a refill request for albuterol.  This appears to be a refill for just a month ago.  It looks like Advair has not been refilled since earlier this year.  If she is having to use her albuterol that frequently she needs to be on Advair as well.  If any confusion about this get her in for recheck to discuss.  Otherwise could you please refill the Advair and albuterol

## 2022-04-18 NOTE — Telephone Encounter (Signed)
Is this okay to refill? 

## 2022-04-19 DIAGNOSIS — F1021 Alcohol dependence, in remission: Secondary | ICD-10-CM | POA: Diagnosis not present

## 2022-04-19 DIAGNOSIS — F411 Generalized anxiety disorder: Secondary | ICD-10-CM | POA: Diagnosis not present

## 2022-04-28 ENCOUNTER — Other Ambulatory Visit: Payer: Self-pay | Admitting: Physician Assistant

## 2022-04-28 NOTE — Telephone Encounter (Signed)
Pt had CPE with Sharon Freeman on 01/03/22

## 2022-04-28 NOTE — Telephone Encounter (Signed)
It doesn't look like we have ever prescribed these for her.  Does she have a psychiatrist? Not really discussed at CPE.

## 2022-04-28 NOTE — Telephone Encounter (Signed)
[facility] is requesting to fill pt trazodone , prozac and lamotrigine. Please advise [facility]

## 2022-05-03 DIAGNOSIS — F1021 Alcohol dependence, in remission: Secondary | ICD-10-CM | POA: Diagnosis not present

## 2022-05-03 DIAGNOSIS — F411 Generalized anxiety disorder: Secondary | ICD-10-CM | POA: Diagnosis not present

## 2022-05-04 ENCOUNTER — Telehealth: Payer: Self-pay | Admitting: Physician Assistant

## 2022-05-04 NOTE — Telephone Encounter (Signed)
Pt had left voicemail, she is getting some medications at local treatment facility and wants to know if she can start Getting them filled here. I called patient back to get more information , left voicemail for her to call office back

## 2022-05-05 ENCOUNTER — Telehealth: Payer: Self-pay

## 2022-05-05 NOTE — Telephone Encounter (Signed)
Pt. Called back today left another message stating that she was getting her Propranolol, Trazodone, Lamictal, and Fluoxetine filled by a doctor [place] at [facility] but she is no longer a pt. There for a while and wanted to know if you could start refilling these medication for her since she is a pt. Here.

## 2022-05-10 NOTE — Telephone Encounter (Signed)
Pt is scheduled for med check with you on 8/30

## 2022-05-11 ENCOUNTER — Other Ambulatory Visit: Payer: Self-pay | Admitting: Physician Assistant

## 2022-05-11 ENCOUNTER — Other Ambulatory Visit: Payer: Self-pay | Admitting: Family Medicine

## 2022-05-11 DIAGNOSIS — L2089 Other atopic dermatitis: Secondary | ICD-10-CM

## 2022-05-11 DIAGNOSIS — J4520 Mild intermittent asthma, uncomplicated: Secondary | ICD-10-CM

## 2022-05-12 DIAGNOSIS — F1021 Alcohol dependence, in remission: Secondary | ICD-10-CM | POA: Diagnosis not present

## 2022-05-12 DIAGNOSIS — F411 Generalized anxiety disorder: Secondary | ICD-10-CM | POA: Diagnosis not present

## 2022-05-16 IMAGING — CR DG RIBS 2V*L*
2 series · 2 of 2 positions shown · non-contrast
Comparison: Chest radiograph also obtained today.

CLINICAL DATA: Palpable tender abnormality in left chest wall.

EXAM:
LEFT RIBS - 2 VIEW

[w ribs ap upper left]
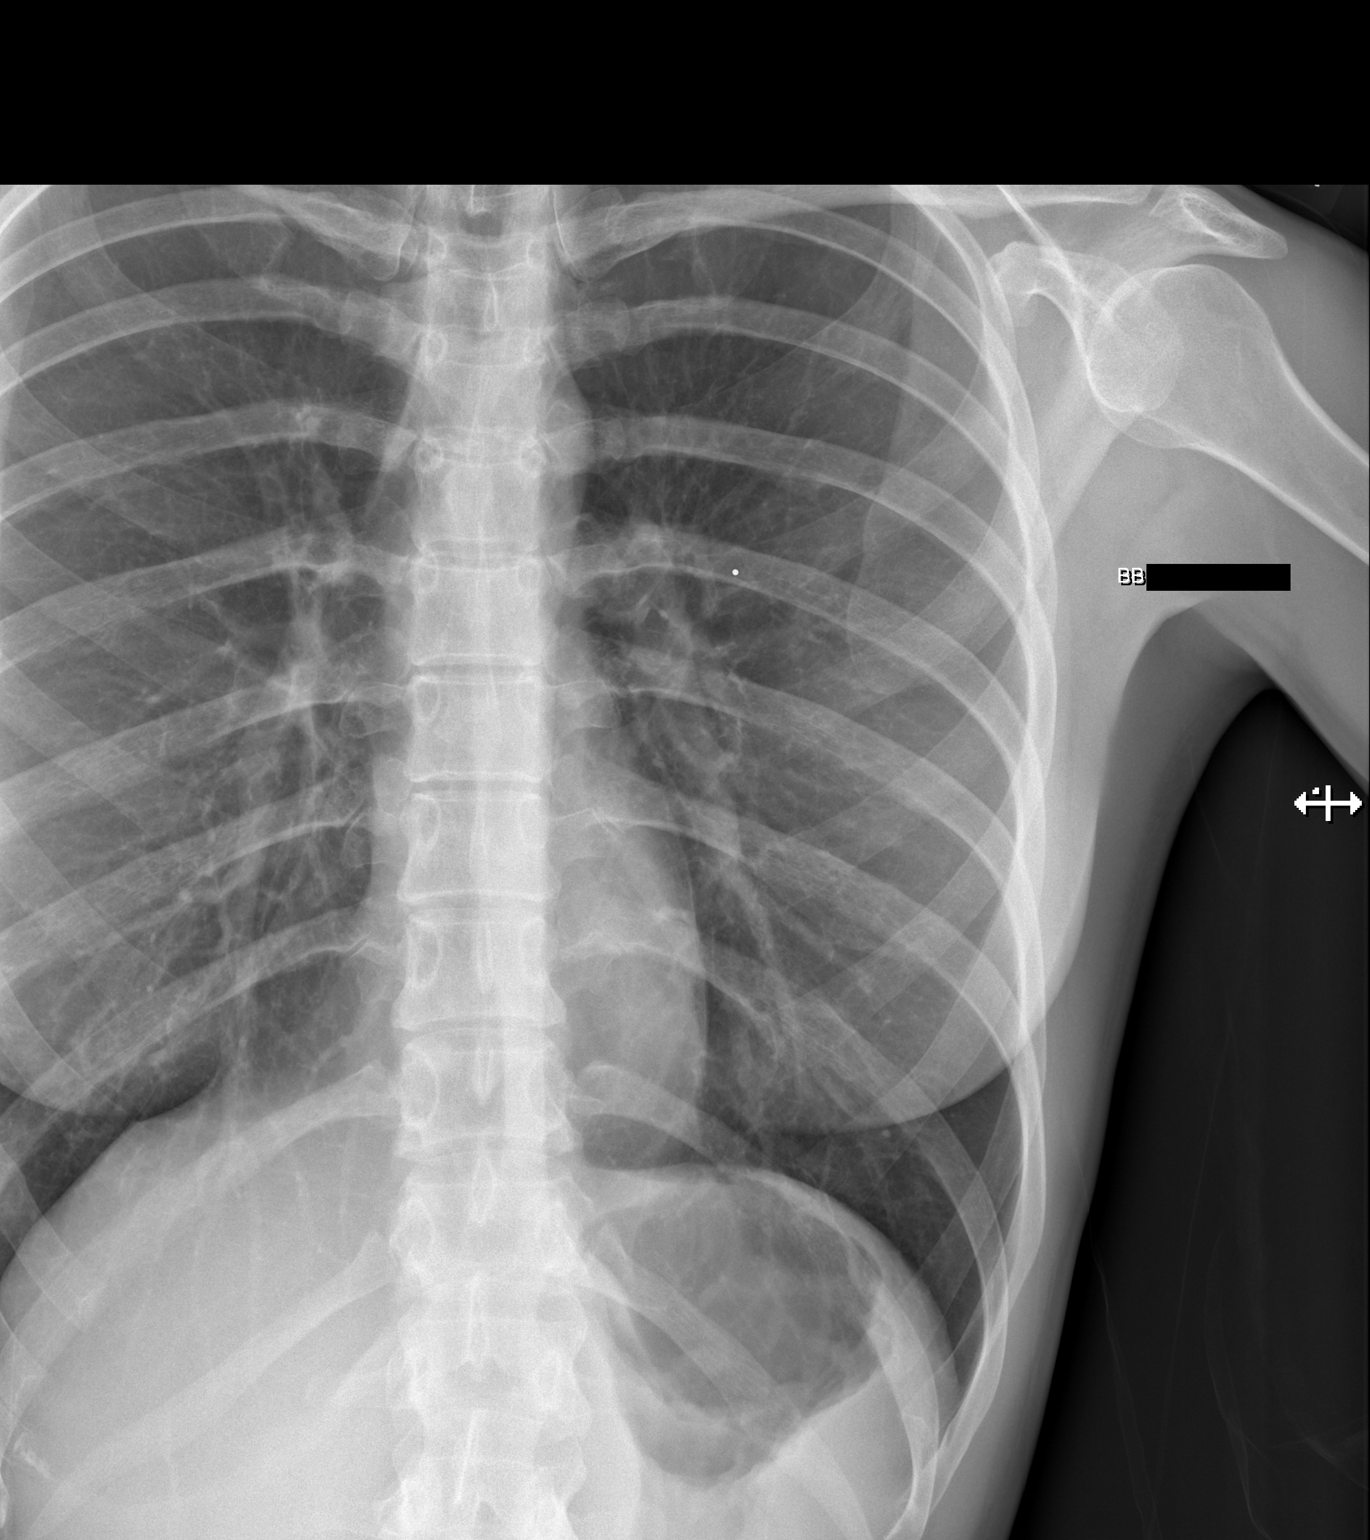

[w ribs obl left]
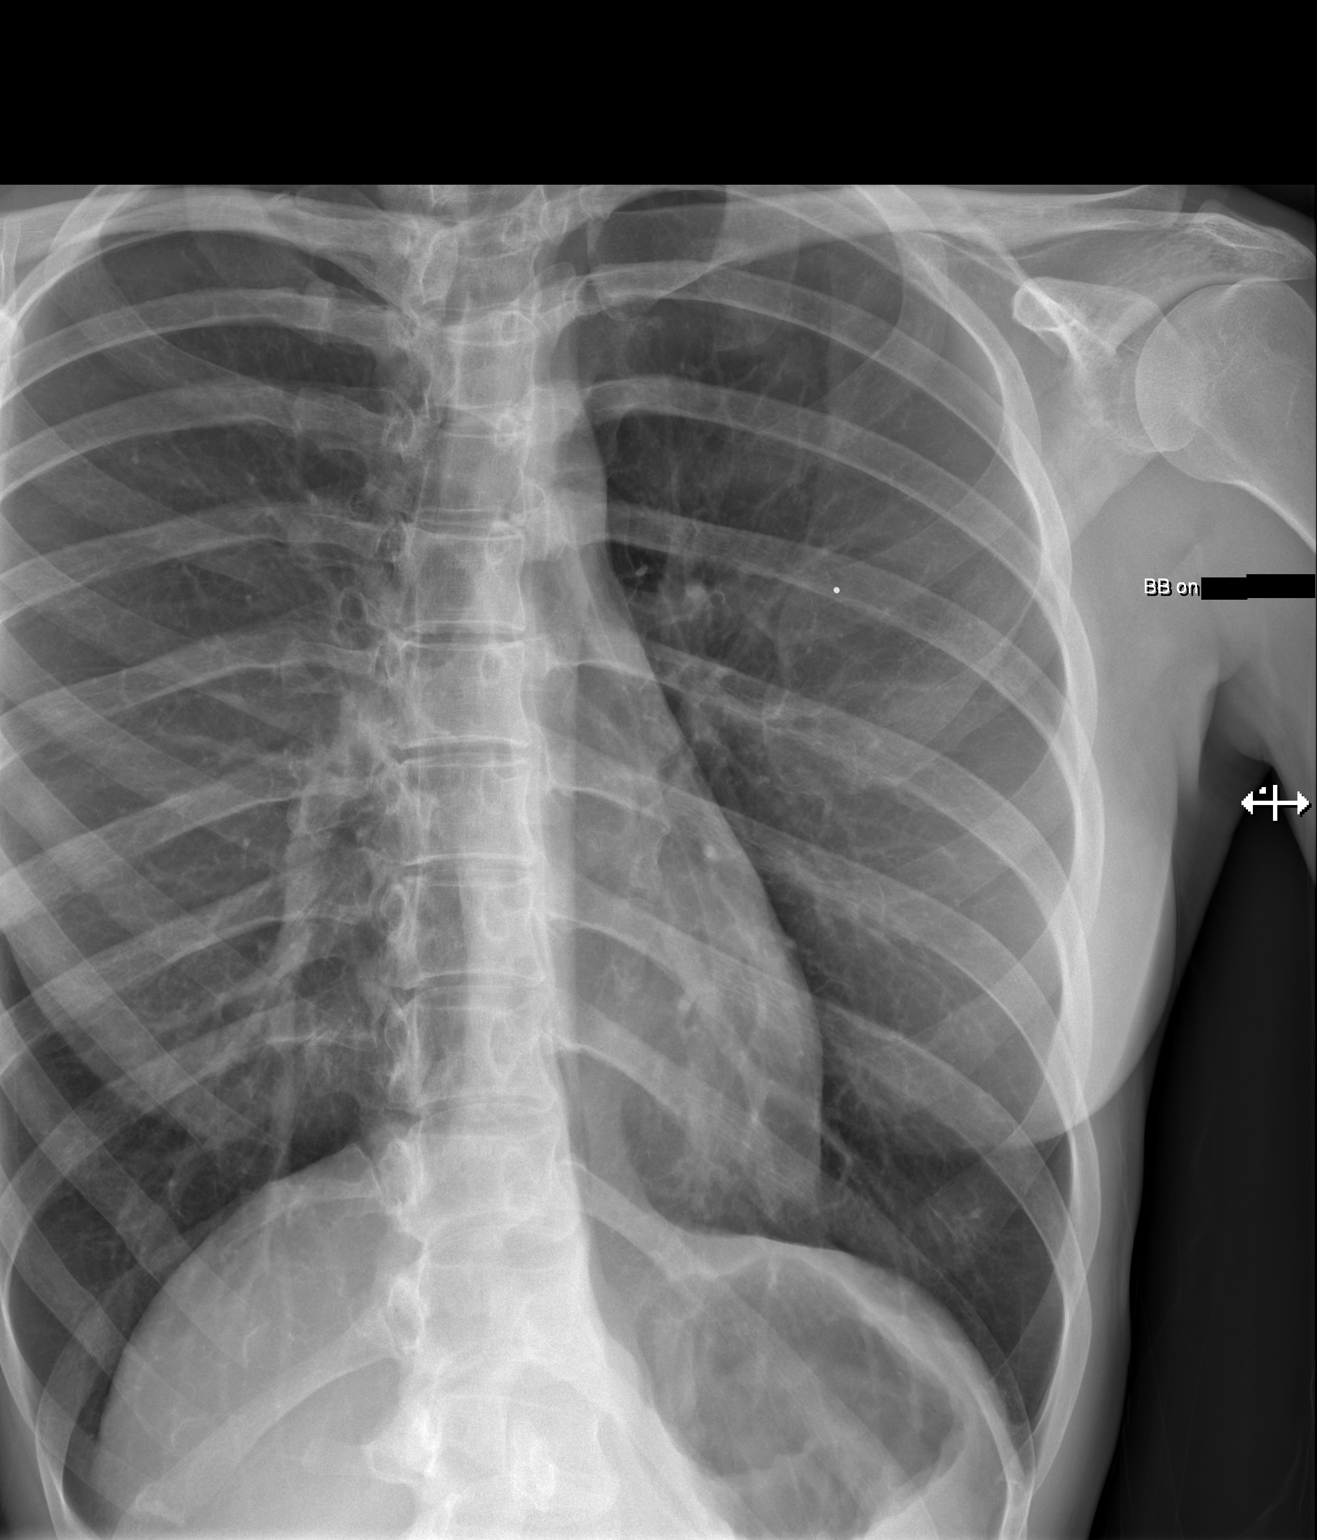

[2 of 2 positions shown; findings below may reference images not displayed]

FINDINGS: No fracture or other bone lesions are seen involving the ribs.
IMPRESSION: Negative.

## 2022-05-16 IMAGING — CR DG CHEST 2V
2 series · 2 of 2 positions shown · non-contrast
Comparison: None.

CLINICAL DATA: Tender palpable abnormality in the left anterior
chest. Asthma.

EXAM:
CHEST - 2 VIEW

[w chest pa]
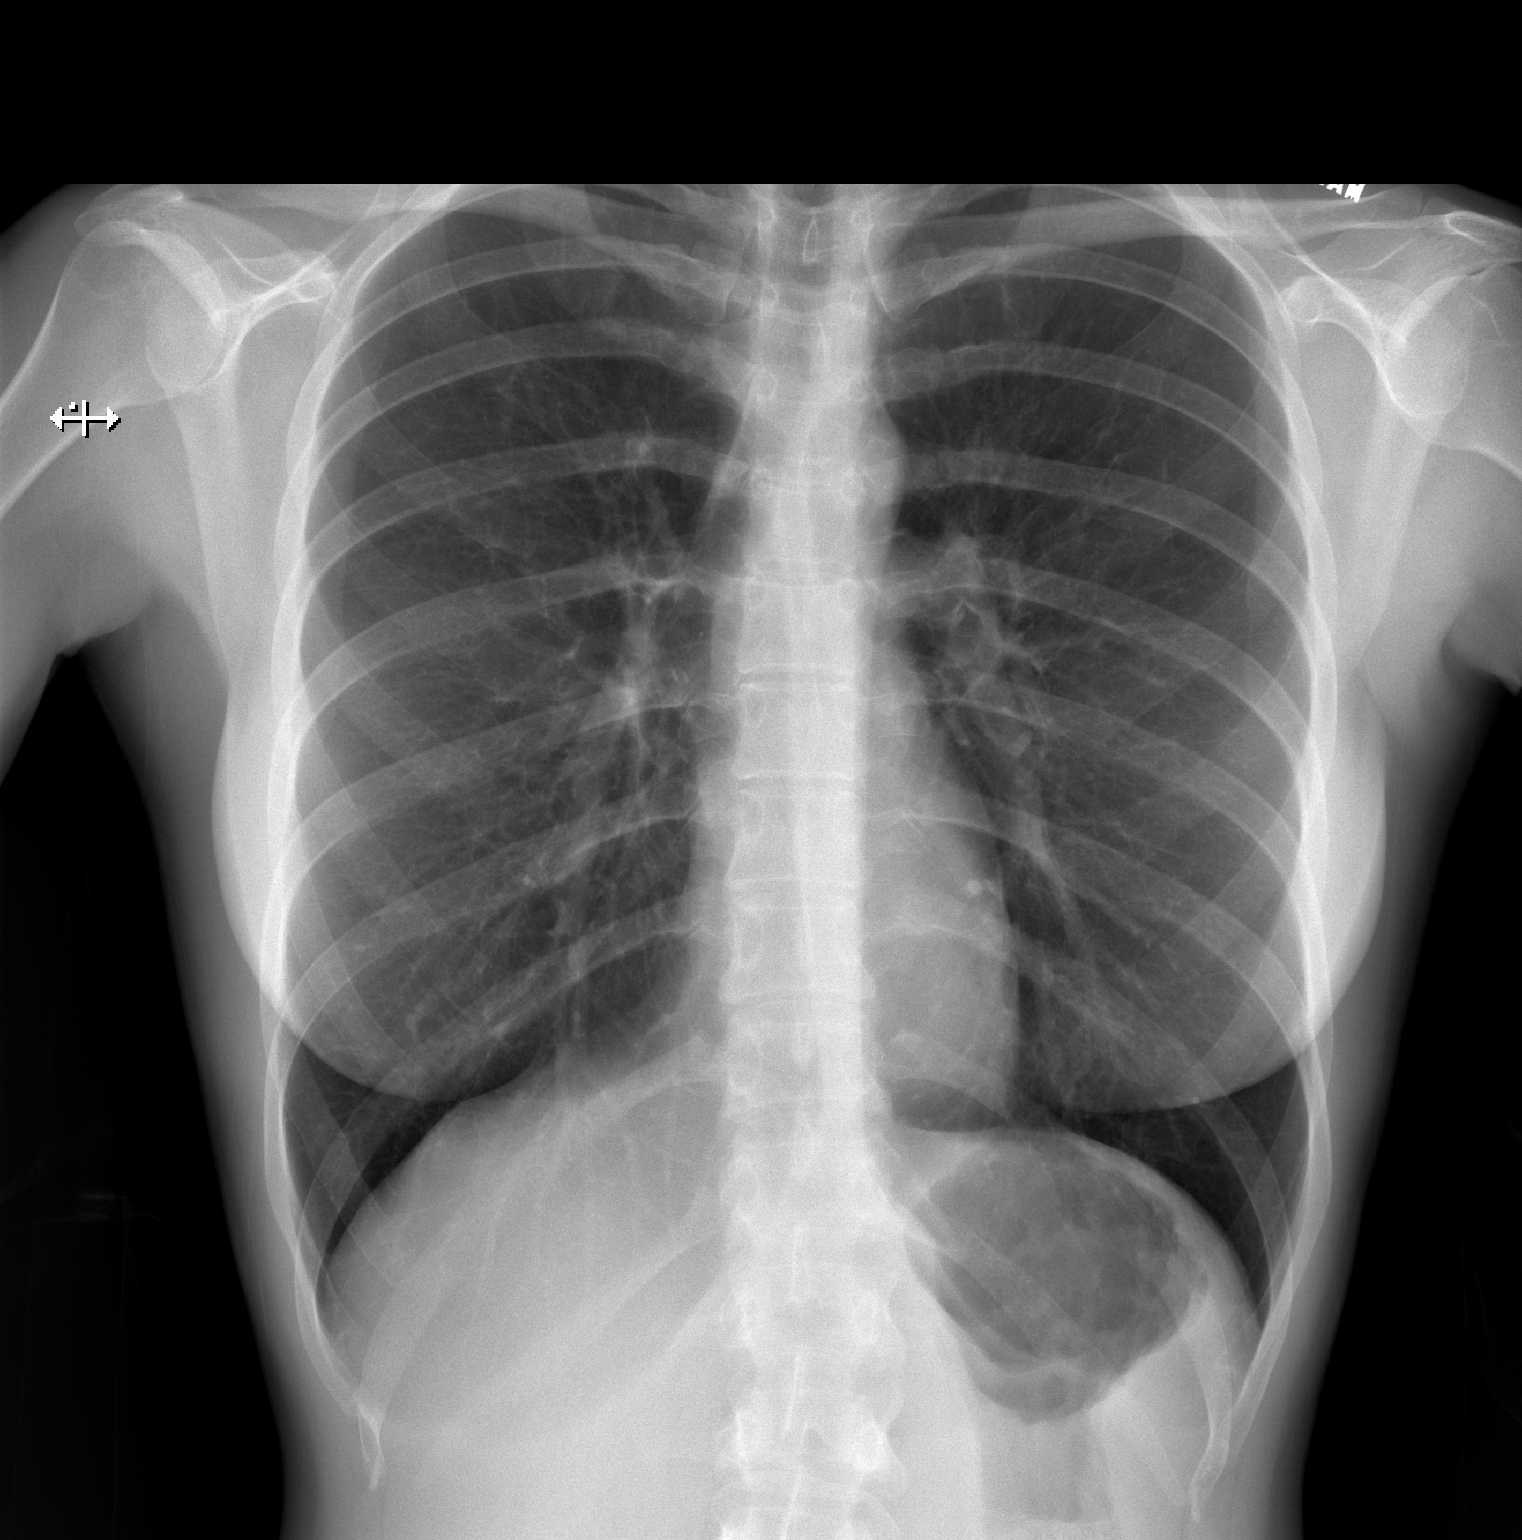

[w chest lat]
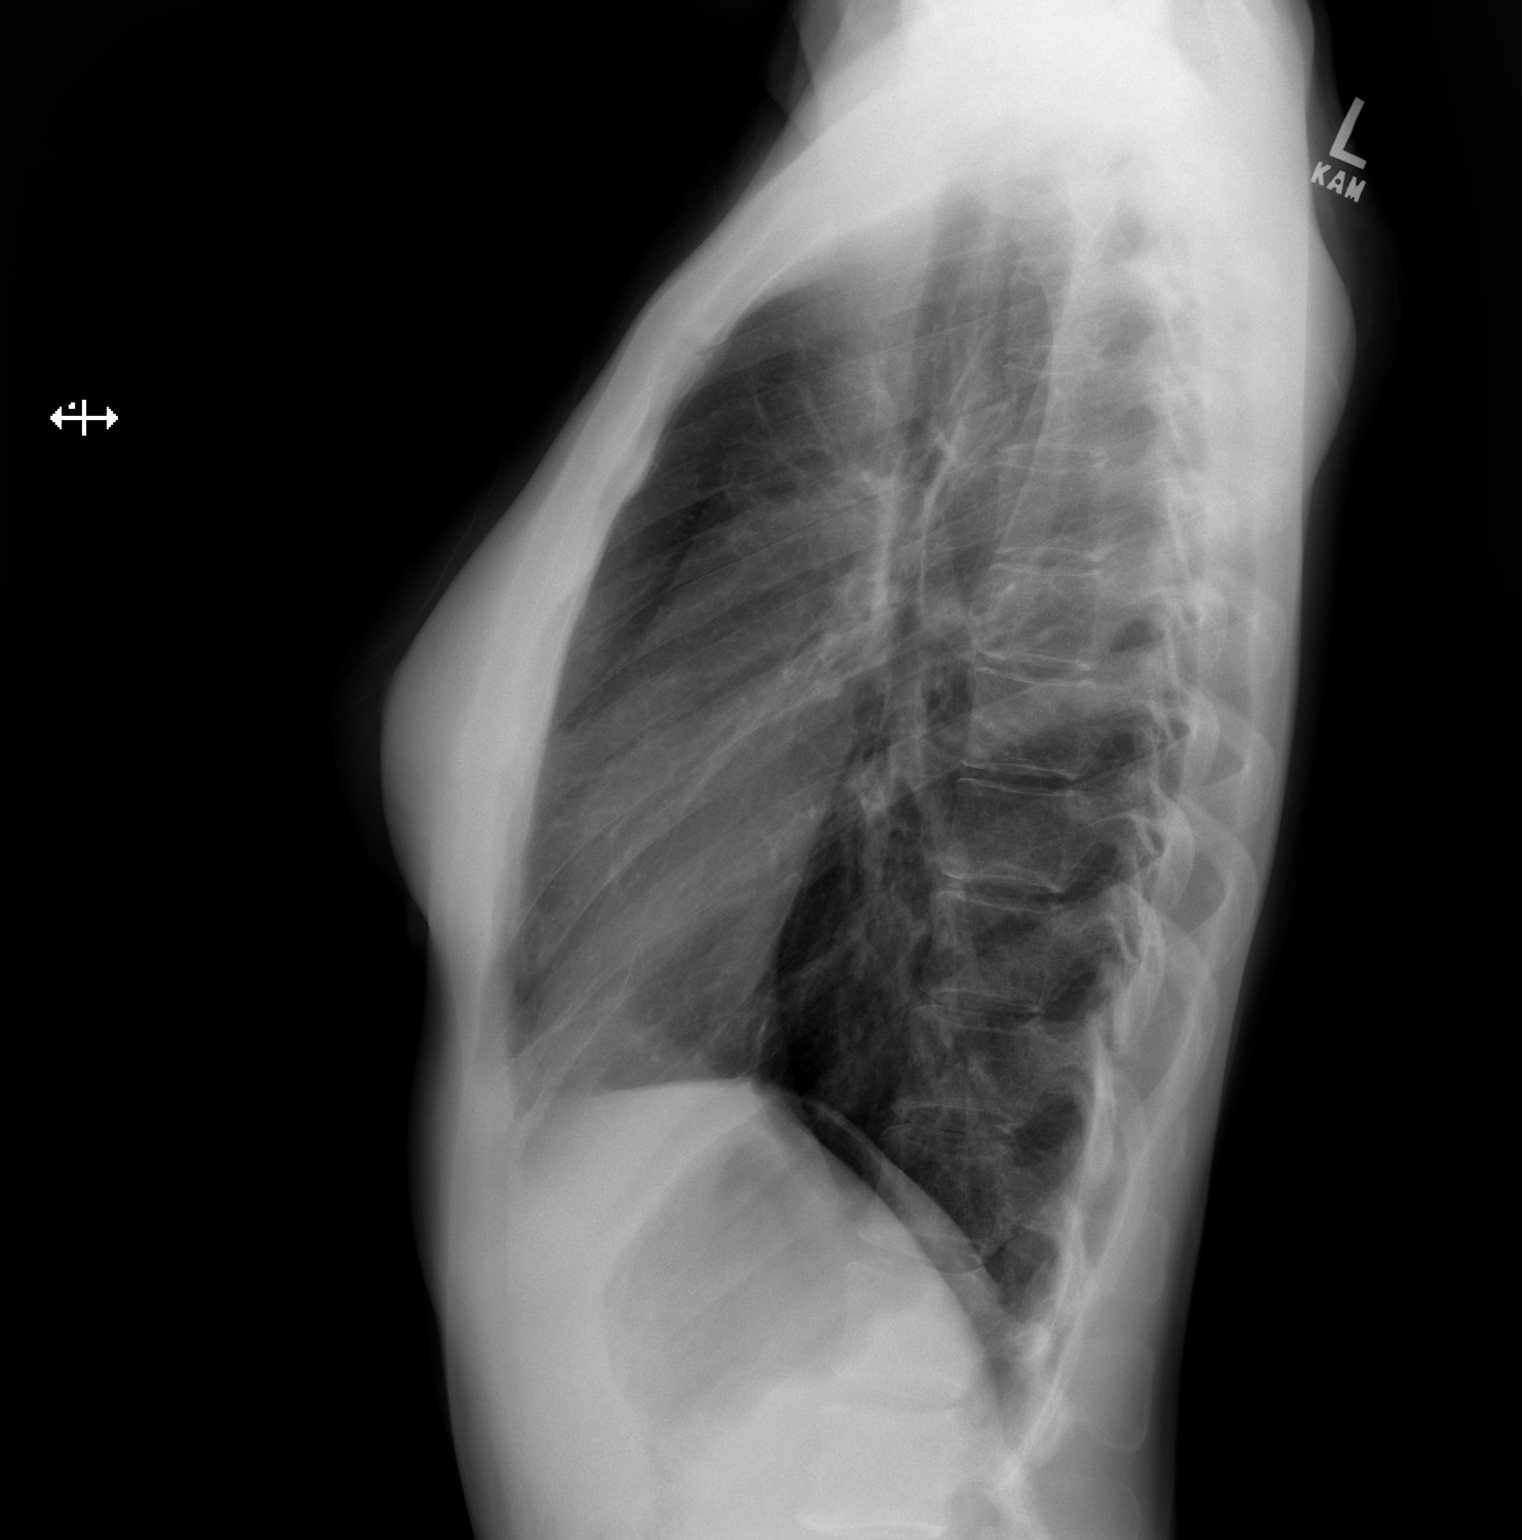

[2 of 2 positions shown; findings below may reference images not displayed]

FINDINGS: The heart size and mediastinal contours are within normal limits.
Pulmonary hyperinflation noted. Both lungs are clear. The visualized
skeletal structures are unremarkable.
IMPRESSION: No active cardiopulmonary disease.

## 2022-05-17 ENCOUNTER — Encounter: Payer: Self-pay | Admitting: Medical

## 2022-05-17 ENCOUNTER — Ambulatory Visit: Payer: BC Managed Care – PPO | Admitting: Medical

## 2022-05-17 VITALS — BP 100/60 | HR 84 | Ht 66.0 in | Wt 121.6 lb

## 2022-05-17 DIAGNOSIS — F419 Anxiety disorder, unspecified: Secondary | ICD-10-CM | POA: Diagnosis not present

## 2022-05-17 DIAGNOSIS — J4520 Mild intermittent asthma, uncomplicated: Secondary | ICD-10-CM

## 2022-05-17 DIAGNOSIS — F1911 Other psychoactive substance abuse, in remission: Secondary | ICD-10-CM | POA: Diagnosis not present

## 2022-05-17 DIAGNOSIS — Z79899 Other long term (current) drug therapy: Secondary | ICD-10-CM | POA: Diagnosis not present

## 2022-05-17 DIAGNOSIS — F1021 Alcohol dependence, in remission: Secondary | ICD-10-CM | POA: Diagnosis not present

## 2022-05-17 DIAGNOSIS — F411 Generalized anxiety disorder: Secondary | ICD-10-CM | POA: Diagnosis not present

## 2022-05-17 MED ORDER — TRAZODONE HCL 50 MG PO TABS
50.0000 mg | ORAL_TABLET | Freq: Every evening | ORAL | 0 refills | Status: AC | PRN
Start: 2022-05-17 — End: ?

## 2022-05-17 MED ORDER — LAMOTRIGINE 100 MG PO TABS: 100.0000 mg | ORAL_TABLET | Freq: Every day | ORAL | 0 refills | Status: DC

## 2022-05-17 MED ORDER — FLUOXETINE HCL 40 MG PO CAPS: 40.0000 mg | ORAL_CAPSULE | Freq: Every day | ORAL | 0 refills | Status: DC

## 2022-05-17 NOTE — Progress Notes (Signed)
Subjective:  Sharon Freeman is a 31 y.o. female who presents for Chief Complaint  Patient presents with   Asthma    Med check, no new concerns.      Here medication concerns.  Was inpatient at [facility] about 3 years ago inpatient, then was follower by them for the last 3 years as an outpatient.  Would like to have her medicaiton refilled here now.  In the past year was seeing [facility] once monthly for in person counseling, and every 3 months for medication review.  Has also had another counselor she would see on and off.   So primarily seeing this counselor every week but getting ready to change to every 2 weeks.     Sees Racheal Patches, counseling ,  private practice.  Lives alone and her dog.  Moving in with a roommate beginning of October.   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     No alcohol, none in 3.5 years.  No drugs. Does vape some but no smoking.    Exercising - with dog walking but sometimes running.  Feels like things are stable for the past few years.   Been on same medication since treatment at [facility].  Diagnoses include anxiety.  Hx/o substance abuse, but no hx/o depression, mania, schizophrenia.     She denies psychiatric illness in family.    But she notes her parents would be open about that anyway.    Is a member of AA, goes to meetings regularly, stays active in this support system.  Confides in family as well.   Hobbies - reading, writing, gardening.  No recent issues with asthma.  No other aggravating or relieving factors.    No other c/o.  Past Medical History:  Diagnosis Date   Asthma    Current Outpatient Medications on File Prior to Visit  Medication Sig Dispense Refill   fluticasone (FLONASE) 50 MCG/ACT nasal spray Place 2 sprays into both nostrils daily.     ADVAIR DISKUS 100-50 MCG/ACT AEPB INHALE 1 PUFF INTO THE LUNGS TWICE A DAY (Patient not taking: Reported on 05/17/2022) 60 each 0   EPINEPHrine 0.3 mg/0.3  mL IJ SOAJ injection Inject 0.3 mg into the muscle as needed for anaphylaxis. (Patient not taking: Reported on 05/17/2022)     propranolol ER (INDERAL LA) 60 MG 24 hr capsule Take 60 mg by mouth daily. (Patient not taking: Reported on 05/17/2022)     triamcinolone cream (KENALOG) 0.1 % APPLY TO AFFECTED AREA TWICE A DAY (Patient not taking: Reported on 05/17/2022) 80 g 0   VENTOLIN HFA 108 (90 Base) MCG/ACT inhaler TAKE 2 PUFFS BY MOUTH EVERY 6 HOURS AS NEEDED FOR WHEEZE OR SHORTNESS OF BREATH (Patient not taking: Reported on 05/17/2022) 18 each 1   No current facility-administered medications on file prior to visit.     The following portions of the patient's history were reviewed and updated as appropriate: allergies, current medications, past family history, past medical history, past social history, past surgical history and problem list.  ROS Otherwise as in subjective above    Objective: BP 100/60   Pulse 84   Ht 5\' 6"  (1.676 m)   Wt 121 lb 9.6 oz (55.2 kg)   LMP 04/06/2022 (Approximate)   BMI 19.63 kg/m   BP Readings from Last 3 Encounters:  05/17/22 100/60  01/03/22 110/70  01/28/21 100/70   Wt Readings from Last 3 Encounters:  05/17/22 121 lb 9.6 oz (55.2  kg)  01/03/22 119 lb (54 kg)  01/28/21 120 lb (54.4 kg)   General appearance: alert, no distress, well developed, well nourished Psych: Pleasant, answers question appropriately, good eye contact    Assessment: Encounter Diagnoses  Name Primary?   Anxiety Yes   History of substance abuse (HCC)    Mild intermittent asthma without complication    High risk medication use      Plan: We reviewed her medications, her psychiatric history and prior therapies over the last 3 years.  Glad to hear she is still doing counseling and attending AA meetings.  I recommend she continue this regularly.  She has been on the same regimen for 3 years she says.  We will continue current medications.  She reports that she does well  on this regimen has been stable.  Sober for the last 3 years.  Plan to follow-up in 3 months on med check  Mild asthma-typically some flareups in the spring in the past but no recent problems in the last year or so  I reviewed over her chart history and prior labs from her last physical recently and early 2023   Jyasia was seen today for asthma.  Diagnoses and all orders for this visit:  Anxiety  History of substance abuse (HCC)  Mild intermittent asthma without complication  High risk medication use  Other orders -     traZODone (DESYREL) 50 MG tablet; Take 1 tablet (50 mg total) by mouth at bedtime as needed for sleep. -     lamoTRIgine (LAMICTAL) 100 MG tablet; Take 1 tablet (100 mg total) by mouth daily. -     FLUoxetine (PROZAC) 40 MG capsule; Take 1 capsule (40 mg total) by mouth daily.    Follow up: 3 months

## 2022-05-31 DIAGNOSIS — F1021 Alcohol dependence, in remission: Secondary | ICD-10-CM | POA: Diagnosis not present

## 2022-05-31 DIAGNOSIS — F411 Generalized anxiety disorder: Secondary | ICD-10-CM | POA: Diagnosis not present

## 2022-06-14 DIAGNOSIS — F411 Generalized anxiety disorder: Secondary | ICD-10-CM | POA: Diagnosis not present

## 2022-06-14 DIAGNOSIS — F1021 Alcohol dependence, in remission: Secondary | ICD-10-CM | POA: Diagnosis not present

## 2022-06-20 DIAGNOSIS — F1021 Alcohol dependence, in remission: Secondary | ICD-10-CM | POA: Diagnosis not present

## 2022-06-20 DIAGNOSIS — F900 Attention-deficit hyperactivity disorder, predominantly inattentive type: Secondary | ICD-10-CM | POA: Diagnosis not present

## 2022-06-20 DIAGNOSIS — F411 Generalized anxiety disorder: Secondary | ICD-10-CM | POA: Diagnosis not present

## 2022-06-28 ENCOUNTER — Encounter: Payer: Self-pay | Admitting: Internal Medicine

## 2022-06-28 DIAGNOSIS — F900 Attention-deficit hyperactivity disorder, predominantly inattentive type: Secondary | ICD-10-CM | POA: Diagnosis not present

## 2022-06-28 DIAGNOSIS — F411 Generalized anxiety disorder: Secondary | ICD-10-CM | POA: Diagnosis not present

## 2022-06-28 DIAGNOSIS — F1021 Alcohol dependence, in remission: Secondary | ICD-10-CM | POA: Diagnosis not present

## 2022-07-12 DIAGNOSIS — F900 Attention-deficit hyperactivity disorder, predominantly inattentive type: Secondary | ICD-10-CM | POA: Diagnosis not present

## 2022-07-12 DIAGNOSIS — F1021 Alcohol dependence, in remission: Secondary | ICD-10-CM | POA: Diagnosis not present

## 2022-07-12 DIAGNOSIS — F411 Generalized anxiety disorder: Secondary | ICD-10-CM | POA: Diagnosis not present

## 2022-07-26 ENCOUNTER — Ambulatory Visit: Payer: BC Managed Care – PPO | Admitting: Medical

## 2022-07-28 DIAGNOSIS — F411 Generalized anxiety disorder: Secondary | ICD-10-CM | POA: Diagnosis not present

## 2022-07-28 DIAGNOSIS — F1021 Alcohol dependence, in remission: Secondary | ICD-10-CM | POA: Diagnosis not present

## 2022-07-28 DIAGNOSIS — F900 Attention-deficit hyperactivity disorder, predominantly inattentive type: Secondary | ICD-10-CM | POA: Diagnosis not present

## 2022-07-31 ENCOUNTER — Other Ambulatory Visit: Payer: Self-pay | Admitting: Medical

## 2022-07-31 DIAGNOSIS — J4520 Mild intermittent asthma, uncomplicated: Secondary | ICD-10-CM

## 2022-08-09 DIAGNOSIS — F900 Attention-deficit hyperactivity disorder, predominantly inattentive type: Secondary | ICD-10-CM | POA: Diagnosis not present

## 2022-08-09 DIAGNOSIS — F411 Generalized anxiety disorder: Secondary | ICD-10-CM | POA: Diagnosis not present

## 2022-08-09 DIAGNOSIS — F1021 Alcohol dependence, in remission: Secondary | ICD-10-CM | POA: Diagnosis not present

## 2022-08-11 ENCOUNTER — Other Ambulatory Visit: Payer: Self-pay | Admitting: Medical

## 2022-08-14 ENCOUNTER — Other Ambulatory Visit: Payer: Self-pay | Admitting: Medical

## 2022-08-15 NOTE — Telephone Encounter (Signed)
Pt has an appt 11/29

## 2022-08-16 DIAGNOSIS — F1021 Alcohol dependence, in remission: Secondary | ICD-10-CM | POA: Diagnosis not present

## 2022-08-16 DIAGNOSIS — F411 Generalized anxiety disorder: Secondary | ICD-10-CM | POA: Diagnosis not present

## 2022-08-16 DIAGNOSIS — F900 Attention-deficit hyperactivity disorder, predominantly inattentive type: Secondary | ICD-10-CM | POA: Diagnosis not present

## 2022-08-17 ENCOUNTER — Encounter: Payer: BC Managed Care – PPO | Admitting: Medical

## 2022-08-18 ENCOUNTER — Telehealth: Payer: Self-pay | Admitting: Medical

## 2022-08-18 ENCOUNTER — Encounter: Payer: Self-pay | Admitting: Medical

## 2022-08-18 NOTE — Telephone Encounter (Signed)
This patient no showed for their appointment yesterday. Which of the following is necessary for this patient.   A) No follow-up necessary   B) Follow-up urgent. Locate Patient Immediately.   C) Follow-up necessary. Contact patient and Schedule visit in ____ Days.   D) Follow-up Advised. Contact patient and Schedule visit in ____ Days.   E) Please Send no show letter to patient. Charge no show fee if no show was a CPE. 

## 2022-09-06 DIAGNOSIS — F1021 Alcohol dependence, in remission: Secondary | ICD-10-CM | POA: Diagnosis not present

## 2022-09-06 DIAGNOSIS — F900 Attention-deficit hyperactivity disorder, predominantly inattentive type: Secondary | ICD-10-CM | POA: Diagnosis not present

## 2022-09-06 DIAGNOSIS — F411 Generalized anxiety disorder: Secondary | ICD-10-CM | POA: Diagnosis not present

## 2022-09-20 DIAGNOSIS — F411 Generalized anxiety disorder: Secondary | ICD-10-CM | POA: Diagnosis not present

## 2022-09-20 DIAGNOSIS — F1021 Alcohol dependence, in remission: Secondary | ICD-10-CM | POA: Diagnosis not present

## 2022-09-20 DIAGNOSIS — F900 Attention-deficit hyperactivity disorder, predominantly inattentive type: Secondary | ICD-10-CM | POA: Diagnosis not present

## 2022-09-20 DIAGNOSIS — Z118 Encounter for screening for other infectious and parasitic diseases: Secondary | ICD-10-CM | POA: Diagnosis not present

## 2022-09-20 DIAGNOSIS — Z681 Body mass index (BMI) 19 or less, adult: Secondary | ICD-10-CM | POA: Diagnosis not present

## 2022-09-20 DIAGNOSIS — Z01419 Encounter for gynecological examination (general) (routine) without abnormal findings: Secondary | ICD-10-CM | POA: Diagnosis not present

## 2022-10-05 DIAGNOSIS — F411 Generalized anxiety disorder: Secondary | ICD-10-CM | POA: Diagnosis not present

## 2022-10-05 DIAGNOSIS — F900 Attention-deficit hyperactivity disorder, predominantly inattentive type: Secondary | ICD-10-CM | POA: Diagnosis not present

## 2022-10-05 DIAGNOSIS — F1021 Alcohol dependence, in remission: Secondary | ICD-10-CM | POA: Diagnosis not present

## 2022-10-19 ENCOUNTER — Other Ambulatory Visit: Payer: Self-pay | Admitting: Medical

## 2022-10-19 DIAGNOSIS — J4520 Mild intermittent asthma, uncomplicated: Secondary | ICD-10-CM

## 2022-10-19 NOTE — Telephone Encounter (Signed)
Left detailed message for pt to call back to schedule med check

## 2022-10-25 ENCOUNTER — Other Ambulatory Visit: Payer: Self-pay | Admitting: Medical

## 2022-10-25 DIAGNOSIS — F4310 Post-traumatic stress disorder, unspecified: Secondary | ICD-10-CM | POA: Diagnosis not present

## 2022-10-25 DIAGNOSIS — F1091 Alcohol use, unspecified, in remission: Secondary | ICD-10-CM | POA: Diagnosis not present

## 2022-10-25 DIAGNOSIS — F331 Major depressive disorder, recurrent, moderate: Secondary | ICD-10-CM | POA: Diagnosis not present

## 2022-10-25 DIAGNOSIS — F411 Generalized anxiety disorder: Secondary | ICD-10-CM | POA: Diagnosis not present

## 2022-10-26 NOTE — Telephone Encounter (Signed)
Sent message for pt to schedule an appt

## 2022-10-26 NOTE — Telephone Encounter (Signed)
Left message for pt to call back to schedule a visit

## 2022-11-01 DIAGNOSIS — F411 Generalized anxiety disorder: Secondary | ICD-10-CM | POA: Diagnosis not present

## 2022-11-01 DIAGNOSIS — F1021 Alcohol dependence, in remission: Secondary | ICD-10-CM | POA: Diagnosis not present

## 2022-11-01 DIAGNOSIS — F900 Attention-deficit hyperactivity disorder, predominantly inattentive type: Secondary | ICD-10-CM | POA: Diagnosis not present

## 2022-11-15 DIAGNOSIS — F331 Major depressive disorder, recurrent, moderate: Secondary | ICD-10-CM | POA: Diagnosis not present

## 2022-11-15 DIAGNOSIS — F411 Generalized anxiety disorder: Secondary | ICD-10-CM | POA: Diagnosis not present

## 2022-11-15 DIAGNOSIS — F1091 Alcohol use, unspecified, in remission: Secondary | ICD-10-CM | POA: Diagnosis not present

## 2022-11-15 DIAGNOSIS — F4310 Post-traumatic stress disorder, unspecified: Secondary | ICD-10-CM | POA: Diagnosis not present

## 2022-11-17 DIAGNOSIS — F1021 Alcohol dependence, in remission: Secondary | ICD-10-CM | POA: Diagnosis not present

## 2022-11-17 DIAGNOSIS — F411 Generalized anxiety disorder: Secondary | ICD-10-CM | POA: Diagnosis not present

## 2022-11-17 DIAGNOSIS — F900 Attention-deficit hyperactivity disorder, predominantly inattentive type: Secondary | ICD-10-CM | POA: Diagnosis not present

## 2022-11-29 DIAGNOSIS — F411 Generalized anxiety disorder: Secondary | ICD-10-CM | POA: Diagnosis not present

## 2022-11-29 DIAGNOSIS — F1021 Alcohol dependence, in remission: Secondary | ICD-10-CM | POA: Diagnosis not present

## 2022-11-29 DIAGNOSIS — F900 Attention-deficit hyperactivity disorder, predominantly inattentive type: Secondary | ICD-10-CM | POA: Diagnosis not present

## 2022-12-13 DIAGNOSIS — F1021 Alcohol dependence, in remission: Secondary | ICD-10-CM | POA: Diagnosis not present

## 2022-12-13 DIAGNOSIS — F411 Generalized anxiety disorder: Secondary | ICD-10-CM | POA: Diagnosis not present

## 2022-12-13 DIAGNOSIS — F900 Attention-deficit hyperactivity disorder, predominantly inattentive type: Secondary | ICD-10-CM | POA: Diagnosis not present

## 2022-12-27 DIAGNOSIS — F900 Attention-deficit hyperactivity disorder, predominantly inattentive type: Secondary | ICD-10-CM | POA: Diagnosis not present

## 2022-12-27 DIAGNOSIS — F1021 Alcohol dependence, in remission: Secondary | ICD-10-CM | POA: Diagnosis not present

## 2022-12-27 DIAGNOSIS — F411 Generalized anxiety disorder: Secondary | ICD-10-CM | POA: Diagnosis not present

## 2023-01-02 ENCOUNTER — Other Ambulatory Visit: Payer: Self-pay | Admitting: Medical

## 2023-01-02 DIAGNOSIS — L2089 Other atopic dermatitis: Secondary | ICD-10-CM

## 2023-01-03 NOTE — Telephone Encounter (Signed)
Called and left message that she is due for a follow-up

## 2023-01-09 ENCOUNTER — Encounter: Payer: BC Managed Care – PPO | Admitting: Physician Assistant

## 2023-01-10 DIAGNOSIS — F411 Generalized anxiety disorder: Secondary | ICD-10-CM | POA: Diagnosis not present

## 2023-01-10 DIAGNOSIS — F1021 Alcohol dependence, in remission: Secondary | ICD-10-CM | POA: Diagnosis not present

## 2023-01-10 DIAGNOSIS — F900 Attention-deficit hyperactivity disorder, predominantly inattentive type: Secondary | ICD-10-CM | POA: Diagnosis not present

## 2023-01-12 DIAGNOSIS — R5383 Other fatigue: Secondary | ICD-10-CM | POA: Diagnosis not present

## 2023-01-12 DIAGNOSIS — Z0000 Encounter for general adult medical examination without abnormal findings: Secondary | ICD-10-CM | POA: Diagnosis not present

## 2023-01-12 DIAGNOSIS — G4709 Other insomnia: Secondary | ICD-10-CM | POA: Diagnosis not present

## 2023-01-12 DIAGNOSIS — Z13220 Encounter for screening for lipoid disorders: Secondary | ICD-10-CM | POA: Diagnosis not present

## 2023-01-12 DIAGNOSIS — L209 Atopic dermatitis, unspecified: Secondary | ICD-10-CM | POA: Diagnosis not present

## 2023-01-12 DIAGNOSIS — J4520 Mild intermittent asthma, uncomplicated: Secondary | ICD-10-CM | POA: Diagnosis not present

## 2023-01-31 DIAGNOSIS — F900 Attention-deficit hyperactivity disorder, predominantly inattentive type: Secondary | ICD-10-CM | POA: Diagnosis not present

## 2023-01-31 DIAGNOSIS — F1021 Alcohol dependence, in remission: Secondary | ICD-10-CM | POA: Diagnosis not present

## 2023-01-31 DIAGNOSIS — F411 Generalized anxiety disorder: Secondary | ICD-10-CM | POA: Diagnosis not present

## 2023-02-07 DIAGNOSIS — F1021 Alcohol dependence, in remission: Secondary | ICD-10-CM | POA: Diagnosis not present

## 2023-02-07 DIAGNOSIS — F411 Generalized anxiety disorder: Secondary | ICD-10-CM | POA: Diagnosis not present

## 2023-02-07 DIAGNOSIS — F900 Attention-deficit hyperactivity disorder, predominantly inattentive type: Secondary | ICD-10-CM | POA: Diagnosis not present

## 2023-02-21 DIAGNOSIS — F411 Generalized anxiety disorder: Secondary | ICD-10-CM | POA: Diagnosis not present

## 2023-02-21 DIAGNOSIS — F900 Attention-deficit hyperactivity disorder, predominantly inattentive type: Secondary | ICD-10-CM | POA: Diagnosis not present

## 2023-02-21 DIAGNOSIS — F1021 Alcohol dependence, in remission: Secondary | ICD-10-CM | POA: Diagnosis not present

## 2023-03-07 DIAGNOSIS — F900 Attention-deficit hyperactivity disorder, predominantly inattentive type: Secondary | ICD-10-CM | POA: Diagnosis not present

## 2023-03-07 DIAGNOSIS — F411 Generalized anxiety disorder: Secondary | ICD-10-CM | POA: Diagnosis not present

## 2023-03-07 DIAGNOSIS — F1021 Alcohol dependence, in remission: Secondary | ICD-10-CM | POA: Diagnosis not present

## 2023-03-21 DIAGNOSIS — F900 Attention-deficit hyperactivity disorder, predominantly inattentive type: Secondary | ICD-10-CM | POA: Diagnosis not present

## 2023-03-21 DIAGNOSIS — F411 Generalized anxiety disorder: Secondary | ICD-10-CM | POA: Diagnosis not present

## 2023-03-21 DIAGNOSIS — F1021 Alcohol dependence, in remission: Secondary | ICD-10-CM | POA: Diagnosis not present

## 2023-04-04 DIAGNOSIS — F900 Attention-deficit hyperactivity disorder, predominantly inattentive type: Secondary | ICD-10-CM | POA: Diagnosis not present

## 2023-04-04 DIAGNOSIS — F1021 Alcohol dependence, in remission: Secondary | ICD-10-CM | POA: Diagnosis not present

## 2023-04-04 DIAGNOSIS — F411 Generalized anxiety disorder: Secondary | ICD-10-CM | POA: Diagnosis not present

## 2023-04-06 DIAGNOSIS — R55 Syncope and collapse: Secondary | ICD-10-CM | POA: Diagnosis not present

## 2023-04-06 DIAGNOSIS — L209 Atopic dermatitis, unspecified: Secondary | ICD-10-CM | POA: Diagnosis not present

## 2023-04-18 DIAGNOSIS — F1021 Alcohol dependence, in remission: Secondary | ICD-10-CM | POA: Diagnosis not present

## 2023-04-18 DIAGNOSIS — F900 Attention-deficit hyperactivity disorder, predominantly inattentive type: Secondary | ICD-10-CM | POA: Diagnosis not present

## 2023-04-18 DIAGNOSIS — F411 Generalized anxiety disorder: Secondary | ICD-10-CM | POA: Diagnosis not present

## 2023-05-02 DIAGNOSIS — F900 Attention-deficit hyperactivity disorder, predominantly inattentive type: Secondary | ICD-10-CM | POA: Diagnosis not present

## 2023-05-02 DIAGNOSIS — F411 Generalized anxiety disorder: Secondary | ICD-10-CM | POA: Diagnosis not present

## 2023-05-02 DIAGNOSIS — F1021 Alcohol dependence, in remission: Secondary | ICD-10-CM | POA: Diagnosis not present

## 2023-05-05 DIAGNOSIS — F900 Attention-deficit hyperactivity disorder, predominantly inattentive type: Secondary | ICD-10-CM | POA: Diagnosis not present

## 2023-05-05 DIAGNOSIS — F1021 Alcohol dependence, in remission: Secondary | ICD-10-CM | POA: Diagnosis not present

## 2023-05-05 DIAGNOSIS — F331 Major depressive disorder, recurrent, moderate: Secondary | ICD-10-CM | POA: Diagnosis not present

## 2023-05-05 DIAGNOSIS — F411 Generalized anxiety disorder: Secondary | ICD-10-CM | POA: Diagnosis not present

## 2023-05-16 DIAGNOSIS — F1021 Alcohol dependence, in remission: Secondary | ICD-10-CM | POA: Diagnosis not present

## 2023-05-16 DIAGNOSIS — F900 Attention-deficit hyperactivity disorder, predominantly inattentive type: Secondary | ICD-10-CM | POA: Diagnosis not present

## 2023-05-16 DIAGNOSIS — F411 Generalized anxiety disorder: Secondary | ICD-10-CM | POA: Diagnosis not present

## 2023-05-24 DIAGNOSIS — F1021 Alcohol dependence, in remission: Secondary | ICD-10-CM | POA: Diagnosis not present

## 2023-05-24 DIAGNOSIS — F900 Attention-deficit hyperactivity disorder, predominantly inattentive type: Secondary | ICD-10-CM | POA: Diagnosis not present

## 2023-05-24 DIAGNOSIS — F411 Generalized anxiety disorder: Secondary | ICD-10-CM | POA: Diagnosis not present

## 2023-05-24 DIAGNOSIS — F331 Major depressive disorder, recurrent, moderate: Secondary | ICD-10-CM | POA: Diagnosis not present

## 2023-05-30 DIAGNOSIS — F900 Attention-deficit hyperactivity disorder, predominantly inattentive type: Secondary | ICD-10-CM | POA: Diagnosis not present

## 2023-05-30 DIAGNOSIS — F1021 Alcohol dependence, in remission: Secondary | ICD-10-CM | POA: Diagnosis not present

## 2023-05-30 DIAGNOSIS — F411 Generalized anxiety disorder: Secondary | ICD-10-CM | POA: Diagnosis not present

## 2023-06-13 DIAGNOSIS — F900 Attention-deficit hyperactivity disorder, predominantly inattentive type: Secondary | ICD-10-CM | POA: Diagnosis not present

## 2023-06-13 DIAGNOSIS — F411 Generalized anxiety disorder: Secondary | ICD-10-CM | POA: Diagnosis not present

## 2023-06-13 DIAGNOSIS — F1021 Alcohol dependence, in remission: Secondary | ICD-10-CM | POA: Diagnosis not present

## 2023-06-22 DIAGNOSIS — F900 Attention-deficit hyperactivity disorder, predominantly inattentive type: Secondary | ICD-10-CM | POA: Diagnosis not present

## 2023-06-22 DIAGNOSIS — F411 Generalized anxiety disorder: Secondary | ICD-10-CM | POA: Diagnosis not present

## 2023-06-22 DIAGNOSIS — F1021 Alcohol dependence, in remission: Secondary | ICD-10-CM | POA: Diagnosis not present

## 2023-06-22 DIAGNOSIS — F331 Major depressive disorder, recurrent, moderate: Secondary | ICD-10-CM | POA: Diagnosis not present

## 2023-06-27 DIAGNOSIS — F411 Generalized anxiety disorder: Secondary | ICD-10-CM | POA: Diagnosis not present

## 2023-06-27 DIAGNOSIS — F900 Attention-deficit hyperactivity disorder, predominantly inattentive type: Secondary | ICD-10-CM | POA: Diagnosis not present

## 2023-06-27 DIAGNOSIS — F1021 Alcohol dependence, in remission: Secondary | ICD-10-CM | POA: Diagnosis not present

## 2023-06-27 DIAGNOSIS — L308 Other specified dermatitis: Secondary | ICD-10-CM | POA: Diagnosis not present

## 2023-06-29 DIAGNOSIS — L308 Other specified dermatitis: Secondary | ICD-10-CM | POA: Diagnosis not present

## 2023-07-11 DIAGNOSIS — F411 Generalized anxiety disorder: Secondary | ICD-10-CM | POA: Diagnosis not present

## 2023-07-11 DIAGNOSIS — F1021 Alcohol dependence, in remission: Secondary | ICD-10-CM | POA: Diagnosis not present

## 2023-07-11 DIAGNOSIS — F900 Attention-deficit hyperactivity disorder, predominantly inattentive type: Secondary | ICD-10-CM | POA: Diagnosis not present

## 2023-07-19 DIAGNOSIS — R0602 Shortness of breath: Secondary | ICD-10-CM | POA: Diagnosis not present

## 2023-07-19 DIAGNOSIS — R21 Rash and other nonspecific skin eruption: Secondary | ICD-10-CM | POA: Diagnosis not present

## 2023-07-25 DIAGNOSIS — F900 Attention-deficit hyperactivity disorder, predominantly inattentive type: Secondary | ICD-10-CM | POA: Diagnosis not present

## 2023-07-25 DIAGNOSIS — F411 Generalized anxiety disorder: Secondary | ICD-10-CM | POA: Diagnosis not present

## 2023-07-25 DIAGNOSIS — F1021 Alcohol dependence, in remission: Secondary | ICD-10-CM | POA: Diagnosis not present

## 2023-07-26 DIAGNOSIS — F411 Generalized anxiety disorder: Secondary | ICD-10-CM | POA: Diagnosis not present

## 2023-07-26 DIAGNOSIS — F900 Attention-deficit hyperactivity disorder, predominantly inattentive type: Secondary | ICD-10-CM | POA: Diagnosis not present

## 2023-07-26 DIAGNOSIS — F331 Major depressive disorder, recurrent, moderate: Secondary | ICD-10-CM | POA: Diagnosis not present

## 2023-07-27 DIAGNOSIS — Z23 Encounter for immunization: Secondary | ICD-10-CM | POA: Diagnosis not present

## 2023-07-27 DIAGNOSIS — R21 Rash and other nonspecific skin eruption: Secondary | ICD-10-CM | POA: Diagnosis not present

## 2023-07-27 DIAGNOSIS — Z0000 Encounter for general adult medical examination without abnormal findings: Secondary | ICD-10-CM | POA: Diagnosis not present

## 2023-07-27 DIAGNOSIS — J020 Streptococcal pharyngitis: Secondary | ICD-10-CM | POA: Diagnosis not present

## 2023-08-08 DIAGNOSIS — F411 Generalized anxiety disorder: Secondary | ICD-10-CM | POA: Diagnosis not present

## 2023-08-08 DIAGNOSIS — F900 Attention-deficit hyperactivity disorder, predominantly inattentive type: Secondary | ICD-10-CM | POA: Diagnosis not present

## 2023-08-08 DIAGNOSIS — F1021 Alcohol dependence, in remission: Secondary | ICD-10-CM | POA: Diagnosis not present

## 2023-08-23 DIAGNOSIS — F1021 Alcohol dependence, in remission: Secondary | ICD-10-CM | POA: Diagnosis not present

## 2023-08-23 DIAGNOSIS — F900 Attention-deficit hyperactivity disorder, predominantly inattentive type: Secondary | ICD-10-CM | POA: Diagnosis not present

## 2023-08-23 DIAGNOSIS — F411 Generalized anxiety disorder: Secondary | ICD-10-CM | POA: Diagnosis not present

## 2023-08-23 DIAGNOSIS — F331 Major depressive disorder, recurrent, moderate: Secondary | ICD-10-CM | POA: Diagnosis not present

## 2023-08-25 DIAGNOSIS — R0602 Shortness of breath: Secondary | ICD-10-CM | POA: Diagnosis not present

## 2023-08-25 DIAGNOSIS — R21 Rash and other nonspecific skin eruption: Secondary | ICD-10-CM | POA: Diagnosis not present

## 2023-08-25 DIAGNOSIS — L508 Other urticaria: Secondary | ICD-10-CM | POA: Diagnosis not present

## 2023-08-25 DIAGNOSIS — L219 Seborrheic dermatitis, unspecified: Secondary | ICD-10-CM | POA: Diagnosis not present

## 2023-09-06 DIAGNOSIS — F411 Generalized anxiety disorder: Secondary | ICD-10-CM | POA: Diagnosis not present

## 2023-09-06 DIAGNOSIS — F900 Attention-deficit hyperactivity disorder, predominantly inattentive type: Secondary | ICD-10-CM | POA: Diagnosis not present

## 2023-09-06 DIAGNOSIS — F1021 Alcohol dependence, in remission: Secondary | ICD-10-CM | POA: Diagnosis not present

## 2023-09-11 DIAGNOSIS — F1021 Alcohol dependence, in remission: Secondary | ICD-10-CM | POA: Diagnosis not present

## 2023-09-11 DIAGNOSIS — F900 Attention-deficit hyperactivity disorder, predominantly inattentive type: Secondary | ICD-10-CM | POA: Diagnosis not present

## 2023-09-11 DIAGNOSIS — F411 Generalized anxiety disorder: Secondary | ICD-10-CM | POA: Diagnosis not present
